# Patient Record
Sex: Male | Born: 1990 | Race: Black or African American | Hispanic: No | Marital: Married | Smoking: Former smoker
Health system: Southern US, Community
[De-identification: ages and names within clinical notes are randomized; demographics above are authoritative.]

## PROBLEM LIST (undated history)

## (undated) DIAGNOSIS — I10 Essential (primary) hypertension: Secondary | ICD-10-CM

## (undated) DIAGNOSIS — K219 Gastro-esophageal reflux disease without esophagitis: Secondary | ICD-10-CM

## (undated) HISTORY — DX: Gastro-esophageal reflux disease without esophagitis: K21.9

---
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
## 2012-03-12 ENCOUNTER — Emergency Department (HOSPITAL_COMMUNITY)
Admission: EM | Admit: 2012-03-12 | Discharge: 2012-03-12 | Disposition: A | Discharge status: Home / Self Care | Payer: Medicaid Other | Attending: Emergency Medicine | Admitting: Emergency Medicine

## 2012-03-12 ENCOUNTER — Encounter (HOSPITAL_COMMUNITY): Payer: Self-pay | Admitting: *Deleted

## 2012-03-12 ENCOUNTER — Emergency Department (HOSPITAL_COMMUNITY): Payer: Medicaid Other

## 2012-03-12 DIAGNOSIS — R220 Localized swelling, mass and lump, head: Secondary | ICD-10-CM | POA: Insufficient documentation

## 2012-03-12 DIAGNOSIS — IMO0002 Reserved for concepts with insufficient information to code with codable children: Secondary | ICD-10-CM | POA: Insufficient documentation

## 2012-03-12 DIAGNOSIS — R109 Unspecified abdominal pain: Secondary | ICD-10-CM | POA: Insufficient documentation

## 2012-03-12 DIAGNOSIS — R51 Headache: Secondary | ICD-10-CM | POA: Insufficient documentation

## 2012-03-12 DIAGNOSIS — R112 Nausea with vomiting, unspecified: Secondary | ICD-10-CM | POA: Insufficient documentation

## 2012-03-12 DIAGNOSIS — T07XXXA Unspecified multiple injuries, initial encounter: Secondary | ICD-10-CM | POA: Insufficient documentation

## 2012-03-12 DIAGNOSIS — M542 Cervicalgia: Secondary | ICD-10-CM | POA: Insufficient documentation

## 2012-03-12 DIAGNOSIS — I10 Essential (primary) hypertension: Secondary | ICD-10-CM | POA: Insufficient documentation

## 2012-03-12 DIAGNOSIS — S46912A Strain of unspecified muscle, fascia and tendon at shoulder and upper arm level, left arm, initial encounter: Secondary | ICD-10-CM

## 2012-03-12 HISTORY — DX: Essential (primary) hypertension: I10

## 2012-03-12 MED ORDER — ONDANSETRON HCL 4 MG/2ML IJ SOLN
4.0000 mg | Freq: Once | INTRAMUSCULAR | Status: AC
  Administered 2012-03-12: 4 mg via INTRAVENOUS
  Filled 2012-03-12: qty 2

## 2012-03-12 MED ORDER — IBUPROFEN 800 MG PO TABS: 800.0000 mg | ORAL_TABLET | Freq: Three times a day (TID) | ORAL | Status: DC

## 2012-03-12 MED ORDER — MORPHINE SULFATE 4 MG/ML IJ SOLN
4.0000 mg | Freq: Once | INTRAMUSCULAR | Status: AC
  Administered 2012-03-12: 4 mg via INTRAVENOUS
  Filled 2012-03-12: qty 1

## 2012-03-12 MED ORDER — ONDANSETRON HCL 4 MG PO TABS: 4.0000 mg | ORAL_TABLET | Freq: Four times a day (QID) | ORAL | Status: DC

## 2012-03-12 NOTE — ED Provider Notes (Signed)
  Medical screening examination/treatment/procedure(s) were performed by non-physician practitioner and as supervising physician I was immediately available for consultation/collaboration.    Vida Roller, MD 03/12/12 [phone]

## 2012-03-12 NOTE — ED Notes (Signed)
Pt had pizza this am and after eating pizza developed n/vx 5.
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
## 2012-03-12 NOTE — ED Notes (Signed)
Pt was given zofran 4 mg iv per ems and 1 liter bolus.

## 2020-07-14 ENCOUNTER — Emergency Department (HOSPITAL_COMMUNITY)
Admission: EM | Admit: 2020-07-14 | Discharge: 2020-07-14 | Disposition: A | Discharge status: Home / Self Care | Payer: No Typology Code available for payment source | Attending: Emergency Medicine | Admitting: Emergency Medicine

## 2020-07-14 ENCOUNTER — Emergency Department (HOSPITAL_COMMUNITY): Payer: No Typology Code available for payment source

## 2020-07-14 ENCOUNTER — Other Ambulatory Visit: Payer: Self-pay

## 2020-07-14 ENCOUNTER — Encounter (HOSPITAL_COMMUNITY): Payer: Self-pay

## 2020-07-14 DIAGNOSIS — M542 Cervicalgia: Secondary | ICD-10-CM | POA: Diagnosis not present

## 2020-07-14 DIAGNOSIS — G44209 Tension-type headache, unspecified, not intractable: Secondary | ICD-10-CM

## 2020-07-14 DIAGNOSIS — I10 Essential (primary) hypertension: Secondary | ICD-10-CM | POA: Insufficient documentation

## 2020-07-14 DIAGNOSIS — R519 Headache, unspecified: Secondary | ICD-10-CM | POA: Diagnosis present

## 2020-07-14 DIAGNOSIS — Y92410 Unspecified street and highway as the place of occurrence of the external cause: Secondary | ICD-10-CM | POA: Diagnosis not present

## 2020-07-14 MED ORDER — FLUTICASONE PROPIONATE 50 MCG/ACT NA SUSP
2.0000 | Freq: Every day | NASAL | Status: DC
  Filled 2020-07-14: qty 16

## 2020-07-14 MED ORDER — LIDOCAINE 5 % EX PTCH
1.0000 | MEDICATED_PATCH | CUTANEOUS | Status: DC
  Administered 2020-07-14: 1 via TRANSDERMAL
  Filled 2020-07-14: qty 1

## 2020-07-14 MED ORDER — PROCHLORPERAZINE EDISYLATE 10 MG/2ML IJ SOLN
10.0000 mg | Freq: Once | INTRAMUSCULAR | Status: AC
  Administered 2020-07-14: 10 mg via INTRAVENOUS
  Filled 2020-07-14: qty 2

## 2020-07-14 MED ORDER — LACTATED RINGERS IV BOLUS
1000.0000 mL | Freq: Once | INTRAVENOUS | Status: AC
  Administered 2020-07-14: 1000 mL via INTRAVENOUS

## 2020-07-14 MED ORDER — DIPHENHYDRAMINE HCL 50 MG/ML IJ SOLN
25.0000 mg | Freq: Once | INTRAMUSCULAR | Status: AC
  Administered 2020-07-14: 25 mg via INTRAVENOUS
  Filled 2020-07-14: qty 1

## 2020-07-14 MED ORDER — KETOROLAC TROMETHAMINE 15 MG/ML IJ SOLN
15.0000 mg | Freq: Once | INTRAMUSCULAR | Status: AC
  Administered 2020-07-14: 15 mg via INTRAMUSCULAR
  Filled 2020-07-14: qty 1
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
## 2020-07-14 NOTE — Discharge Instructions (Addendum)
Take ibuprofen and/or acetaminophen as needed for headache.  Apply ice as needed.
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
## 2020-11-05 ENCOUNTER — Other Ambulatory Visit: Payer: Self-pay

## 2020-11-05 ENCOUNTER — Ambulatory Visit (HOSPITAL_COMMUNITY)
Admission: EM | Admit: 2020-11-05 | Discharge: 2020-11-05 | Disposition: A | Discharge status: Home / Self Care | Payer: Self-pay | Attending: Emergency Medicine | Admitting: Emergency Medicine

## 2020-11-05 ENCOUNTER — Encounter (HOSPITAL_COMMUNITY): Payer: Self-pay | Admitting: Emergency Medicine

## 2020-11-05 DIAGNOSIS — R42 Dizziness and giddiness: Secondary | ICD-10-CM

## 2020-11-05 DIAGNOSIS — R519 Headache, unspecified: Secondary | ICD-10-CM

## 2020-11-05 DIAGNOSIS — E860 Dehydration: Secondary | ICD-10-CM

## 2020-11-05 MED ORDER — KETOROLAC TROMETHAMINE 30 MG/ML IJ SOLN
INTRAMUSCULAR | Status: AC
  Filled 2020-11-05: qty 1

## 2020-11-05 MED ORDER — ONDANSETRON 4 MG PO TBDP: 4.0000 mg | ORAL_TABLET | Freq: Three times a day (TID) | ORAL | 0 refills | Status: DC | PRN

## 2020-11-05 MED ORDER — IBUPROFEN 600 MG PO TABS: 600.0000 mg | ORAL_TABLET | Freq: Four times a day (QID) | ORAL | 0 refills | Status: DC | PRN

## 2020-11-05 MED ORDER — ACETAMINOPHEN 325 MG PO TABS
975.0000 mg | ORAL_TABLET | Freq: Once | ORAL | Status: AC
  Administered 2020-11-05: 975 mg via ORAL

## 2020-11-05 MED ORDER — ACETAMINOPHEN 325 MG PO TABS
ORAL_TABLET | ORAL | Status: AC
  Filled 2020-11-05: qty 3

## 2020-11-05 MED ORDER — KETOROLAC TROMETHAMINE 30 MG/ML IJ SOLN
30.0000 mg | Freq: Once | INTRAMUSCULAR | Status: AC
  Administered 2020-11-05: 30 mg via INTRAMUSCULAR

## 2020-11-05 NOTE — ED Triage Notes (Signed)
C/o dizziness, headache, concerned for blood pressure is up.  This started yesterday.  If patient"moves too fast, he gets dizzy"

## 2020-11-05 NOTE — ED Provider Notes (Signed)
HPI  SUBJECTIVE:  Robert Vazquez is a 30 y.o. male who reports a constant, gradual onset, diffuse headache associated with intermittent episodes of dizziness that lasts 30 to 60 seconds described as feeling off balance.  Headache started yesterday.  He denies vertigo.  He reports high-pitched tinnitus in his left ear yesterday, but this has resolved.  He reports mild photophobia, phonophobia.  He has not tried anything for his headache.  No alleviating factors.  Symptoms worse with noise.  The dizziness is worse with standing up rapidly, with turning his head.  No ear pain, change in hearing, otorrhea, nausea, vomiting, visual changes, fevers, nasal congestion, sinus pain or pressure, jaw pain, dental pain, neck stiffness, chest pain, palpitations, shortness of breath, arm or leg weakness, facial droop, slurred speech, syncope, seizures.  No nausea, diaphoresis with the dizziness.  This is not the first or worst headache of his life.  He has never had dizziness like this before.  States that he drinks about a cup of water per day, and has been working outside in the heat without rehydrating.  He states that his urine is darker than usual.  He has a past medical history of headaches, states this feels similar to previous, hypertension, has not been on medications in a year.  No history of diabetes, MI, coronary disease, vertigo, stroke, aneurysm, atrial fibrillation, illicit drug use.  Family history negative for stroke, aneurysm.  PMD: None.    Past Medical History:  Diagnosis Date   Hypertension     History reviewed. No pertinent surgical history.  Family History  Problem Relation Age of Onset   CAD Father     Social History   Tobacco Use   Smoking status: Never   Smokeless tobacco: Never  Vaping Use   Vaping Use: Every day  Substance Use Topics   Alcohol use: No   Drug use: No    No current facility-administered medications for this encounter.  Current Outpatient Medications:     ibuprofen (ADVIL) 600 MG tablet, Take 1 tablet (600 mg total) by mouth every 6 (six) hours as needed., Disp: 30 tablet, Rfl: 0   ondansetron (ZOFRAN ODT) 4 MG disintegrating tablet, Take 1 tablet (4 mg total) by mouth every 8 (eight) hours as needed for nausea or vomiting., Disp: 20 tablet, Rfl: 0  No Known Allergies   ROS  As noted in HPI.   Physical Exam  BP 124/62 (BP Location: Right Arm)   Pulse 66   Temp 98.1 F (36.7 C) (Oral)   Resp 20   SpO2 99%   Constitutional: Well developed, well nourished, no acute distress Eyes: PERRL, EOMI, conjunctiva normal bilaterally.  Mild photophobia. HENT: Normocephalic, atraumatic,mucus membranes moist, normal dentition.  TM normal b/l. No TMJ tenderness.  No nasal congestion, no sinus tenderness. No temporal artery tenderness.  Neck: no cervical LN.  Positive bilateral trapezial muscle tenderness. No meningismus Respiratory: normal inspiratory effort Cardiovascular: Normal rate, regular rhythm.  No murmurs, rubs, gallops.  No carotid bruit. GI:  nondistended skin: No rash, skin intact Musculoskeletal: No edema, no tenderness, no deformities Neurologic: Alert & oriented x 3, CN III-XII intact, romberg neg, finger-> nose, heel-> shin equal b/l, Romberg neg, tandem gait steady.  Positive Dix-Hallpike bilaterally on the left more than right.  Positive dizziness with sitting up rapidly. Psychiatric: Speech and behavior appropriate   ED Course  Medications - No data to display  No orders of the defined types were placed in this encounter.  No results found for this or any previous visit (from the past 24 hour(s)). No results found.   ED Clinical Impression  1. Dehydration   2. Lightheadedness   3. Acute nonintractable headache, unspecified headache type     ED Assessment/Plan  Pt describing typical pain, no sudden onset. Doubt SAH, ICH or space occupying lesion. Pt without fevers/chills, Pt has no meningeal sx, no nuchal  rigidity. Doubt meningitis. Pt with normal neuro exam, no evidence of CVA/TIA.  Pt BP not elevated significantly, doubt hypertensive emergency. No evidence of temporal artery tenderness, no evidence of glaucoma or other ocular pathology.   Suspect that the patient's headache and lightheadedness is coming from being dehydrated.  Also may have a musculoskeletal component due to the bilateral trapezial tenderness.  No evidence of meningitis.  States he drinks a cup of water and has been outside working in the heat.  He has been working more recently.  Does not appear to be vertigo.  He gets dizzy with large positional changes.  No evidence of stroke.  Doubt aneurysm.  Will have him push fluids, send home with Tylenol/ibuprofen, Zofran in case this is a migraine.  Giving Toradol and Tylenol here.  Will provide primary care list for ongoing care and order assistance in finding a PMD.  Strict ER return precautions given.  Discussed medical decision making, treatment plan, plan for follow-up, signs and symptoms that should prompt return to the emergency department.  Patient agrees with plan   Meds ordered this encounter  Medications   ibuprofen (ADVIL) 600 MG tablet    Sig: Take 1 tablet (600 mg total) by mouth every 6 (six) hours as needed.    Dispense:  30 tablet    Refill:  0   ondansetron (ZOFRAN ODT) 4 MG disintegrating tablet    Sig: Take 1 tablet (4 mg total) by mouth every 8 (eight) hours as needed for nausea or vomiting.    Dispense:  20 tablet    Refill:  0    *This clinic note was created using Scientist, clinical (histocompatibility and immunogenetics). Therefore, there may be occasional mistakes despite careful proofreading.  ?    Domenick Gong, MD 11/06/20 [phone]

## 2020-11-05 NOTE — Discharge Instructions (Addendum)
I suspect that your symptoms are from being dehydrated.  You must drink at least 2 L of nonalcoholic, not sugary, noncaffeinated beverages a day, more if you are outside working in the heat.  I want you pushing electrolyte containing fluids such as Pedialyte or Gatorade until your urine is clear.  You may take 600 mg of ibuprofen combined with 1000 mg of Tylenol 3-4 times a day as needed for headache.  Zofran in case you get nauseous.  Follow-up with a primary care provider of your choice, see list below Below is a list of primary care practices who are taking new patients for you to follow-up with.  [facility] internal medicine clinic Ground Floor - [facility], [address] [place], [place], [place] [address] [phone]  [facility] at [facility] [address] [place], [place] [address] [phone]  [facility] [address] [place], [place] [address] [phone]  [facility] [phone] [address] [place] [address]  [facility]: [address] [place] [address]  [phone]  [facility]: [address] [place] [address]  [phone]  [place] [facility] : [address] [place] [address] [phone]  [facility]: [address] [place] [address] [phone]  [facility]: [address] [place] [address] [phone]  Dr. Oneal Grout [address] [facility] [place] [address]  [phone]  Go to www.goodrx.com  or www.costplusdrugs.com to look up your medications. This will give you a list of where you can find your prescriptions at the most affordable prices. Or ask the pharmacist what the cash price is, or if they have any other discount programs  available to help make your medication more affordable. This can be less expensive than what you would pay with insurance.

## 2020-11-07 ENCOUNTER — Encounter (HOSPITAL_COMMUNITY): Payer: Self-pay | Admitting: Emergency Medicine

## 2020-11-07 ENCOUNTER — Emergency Department (HOSPITAL_COMMUNITY)
Admission: EM | Admit: 2020-11-07 | Discharge: 2020-11-08 | Disposition: A | Discharge status: Home / Self Care | Payer: Self-pay | Attending: Emergency Medicine | Admitting: Emergency Medicine

## 2020-11-07 ENCOUNTER — Other Ambulatory Visit: Payer: Self-pay

## 2020-11-07 ENCOUNTER — Emergency Department (HOSPITAL_COMMUNITY): Payer: Self-pay

## 2020-11-07 DIAGNOSIS — R42 Dizziness and giddiness: Secondary | ICD-10-CM | POA: Insufficient documentation

## 2020-11-07 DIAGNOSIS — I10 Essential (primary) hypertension: Secondary | ICD-10-CM | POA: Insufficient documentation

## 2020-11-07 DIAGNOSIS — R0789 Other chest pain: Secondary | ICD-10-CM | POA: Insufficient documentation

## 2020-11-07 DIAGNOSIS — R202 Paresthesia of skin: Secondary | ICD-10-CM | POA: Insufficient documentation

## 2020-11-07 LAB — CBC WITH DIFFERENTIAL/PLATELET
Abs Immature Granulocytes: 0.02 10*3/uL (ref 0.00–0.07)
Basophils Absolute: 0.1 10*3/uL (ref 0.0–0.1)
Basophils Relative: 1 %
Eosinophils Absolute: 0.1 10*3/uL (ref 0.0–0.5)
Eosinophils Relative: 2 %
HCT: 45.5 % (ref 39.0–52.0)
Hemoglobin: 15.4 g/dL (ref 13.0–17.0)
Immature Granulocytes: 0 %
Lymphocytes Relative: 38 %
Lymphs Abs: 1.8 10*3/uL (ref 0.7–4.0)
MCH: 32.6 pg (ref 26.0–34.0)
MCHC: 33.8 g/dL (ref 30.0–36.0)
MCV: 96.2 fL (ref 80.0–100.0)
Monocytes Absolute: 0.4 10*3/uL (ref 0.1–1.0)
Monocytes Relative: 8 %
Neutro Abs: 2.5 10*3/uL (ref 1.7–7.7)
Neutrophils Relative %: 51 %
Platelets: 279 10*3/uL (ref 150–400)
RBC: 4.73 MIL/uL (ref 4.22–5.81)
RDW: 12.6 % (ref 11.5–15.5)
WBC: 4.9 10*3/uL (ref 4.0–10.5)
nRBC: 0 % (ref 0.0–0.2)

## 2020-11-07 NOTE — ED Triage Notes (Signed)
Patient arrived with EMS from home reports left chest pain and left torso pain for 3 days , no SOB or emesis , seen at an urgent care yesterday for the same complaints.

## 2020-11-07 NOTE — ED Provider Notes (Signed)
Emergency Medicine Provider Triage Evaluation Note  Robert Vazquez , a 30 y.o. male  was evaluated in triage.  Pt complains of dizziness.  The patient presents with a 2-day history of a constellation of symptoms left-sided chest pain that radiates up into his the left side of his neck, headaches, intermittent dizziness.  He is also noted that his tongue seems to be tingling.  He has noticed some twitching to his right thumb and also sporadically to his face.  He denies any other muscle cramping, but does note that his symptoms began when he was having generalized body aches.  No fever, chills, shortness of breath, vomiting.  He noted that his vision seemed to be worse while he was driving.  He describes it as " what he was seeing in front of him seem to be moving faster than what he could process."  No diplopia or vision loss.  He was seen at urgent care yesterday for the same and was given medication for headache and discharged home.  Patient adamantly denies cocaine use or other drug use.  No history of similar.  Review of Systems  Positive: Chest pain, myalgias, visual changes, headache, dizziness, muscle fasciculations, change in taste Negative: Abdominal pain, shortness of breath, rash, paresthesias, numbness, weakness, vomiting  Physical Exam  BP 133/88 (BP Location: Right Arm)   Pulse 80   Temp 98.2 F (36.8 C) (Oral)   Resp 20   SpO2 98%  Gen:   Awake, no distress   Resp:  Normal effort  MSK:   Moves extremities without difficulty  Other:  Sensation to light touch is intact to the bilateral arms and legs.  Extraocular movements are intact.  Mild reproducible tenderness to palpation to the left chest wall.  No crepitus or step-offs.  Medical Decision Making  Medically screening exam initiated at 10:50 PM.  Appropriate orders placed.  Jermanie Minshall Rodda was informed that the remainder of the evaluation will be completed by another provider, this initial triage assessment does not replace  that evaluation, and the importance of remaining in the ED until their evaluation is complete.  Patient's work-up has been initiated in the ED.  Labs have been ordered.  He will likely require additional imaging during his ED visit.  He will require further work-up and evaluation in the ED.   Frederik Pear A, PA-C 11/07/20 2253    Derwood Kaplan, MD 11/08/20 [phone]

## 2020-11-08 ENCOUNTER — Emergency Department (HOSPITAL_COMMUNITY): Payer: Self-pay

## 2020-11-08 LAB — COMPREHENSIVE METABOLIC PANEL
ALT: 17 U/L (ref 0–44)
AST: 25 U/L (ref 15–41)
Albumin: 4 g/dL (ref 3.5–5.0)
Alkaline Phosphatase: 64 U/L (ref 38–126)
Anion gap: 8 (ref 5–15)
BUN: 12 mg/dL (ref 6–20)
CO2: 27 mmol/L (ref 22–32)
Calcium: 9.5 mg/dL (ref 8.9–10.3)
Chloride: 104 mmol/L (ref 98–111)
Creatinine, Ser: 1.36 mg/dL — ABNORMAL HIGH (ref 0.61–1.24)
GFR, Estimated: >60 mL/min (ref >60)
Glucose, Bld: 94 mg/dL (ref 70–99)
Potassium: 3.4 mmol/L — ABNORMAL LOW (ref 3.5–5.1)
Sodium: 139 mmol/L (ref 135–145)
Total Bilirubin: 0.6 mg/dL (ref 0.3–1.2)
Total Protein: 7.1 g/dL (ref 6.5–8.1)

## 2020-11-08 LAB — CK: Total CK: 496 U/L — ABNORMAL HIGH (ref 49–397)

## 2020-11-08 LAB — TROPONIN I (HIGH SENSITIVITY)
Troponin I (High Sensitivity): 4 ng/L (ref <18)
Troponin I (High Sensitivity): 5 ng/L (ref <18)

## 2020-11-08 MED ORDER — KETOROLAC TROMETHAMINE 60 MG/2ML IM SOLN
60.0000 mg | Freq: Once | INTRAMUSCULAR | Status: AC
  Administered 2020-11-08: 60 mg via INTRAMUSCULAR
  Filled 2020-11-08: qty 2

## 2020-11-08 MED ORDER — KETOROLAC TROMETHAMINE 15 MG/ML IJ SOLN: 15.0000 mg | Freq: Once | INTRAMUSCULAR | Status: DC

## 2020-11-08 NOTE — ED Provider Notes (Signed)
[name] EMERGENCY DEPARTMENT Provider Note   CSN: [id number] Arrival date & time: 11/07/20  2212     History Chief Complaint  Patient presents with   Chest Pain    Robert Vazquez is a 30 y.o. male.  The history is provided by the patient, the spouse and medical records.  Chest Pain Robert Vazquez is a 30 y.o. male who presents to the Emergency Department complaining of chest pain.  He presents to the ED accompanied by his wife for evaluation of left sided chest pain that started two days ago.  Pain is described as sharp, non radiating.  Pain is constant.  No significant change with breathing, movement, activity.    No associated fever, cough, AP, N/V, leg swelling/pain.  Has some mild lightheadedness.    Had similar episode in the past and cause was not identified.  He is right hand dominant.    Takes no medications.  No hx/o DVT/PE.  Unsure of family cardiac hx.  Vapes.  No alcohol.  No drugs.    Earlier he did experience numbness to the right tongue and felt like his right hand was twitching.  Episode lasted about 15 minutes.  He was anxious when this occurred.      Past Medical History:  Diagnosis Date   Hypertension     There are no problems to display for this patient.   History reviewed. No pertinent surgical history.     Family History  Problem Relation Age of Onset   CAD Father     Social History   Tobacco Use   Smoking status: Never   Smokeless tobacco: Never  Vaping Use   Vaping Use: Every day  Substance Use Topics   Alcohol use: No   Drug use: No    Home Medications Prior to Admission medications   Medication Sig Start Date End Date Taking? Authorizing Provider  ibuprofen (ADVIL) 600 MG tablet Take 1 tablet (600 mg total) by mouth every 6 (six) hours as needed. 11/05/20   Domenick Gong, MD  ondansetron (ZOFRAN ODT) 4 MG disintegrating tablet Take 1 tablet (4 mg total) by mouth every 8 (eight) hours as needed for  nausea or vomiting. 11/05/20   Domenick Gong, MD    Allergies    Patient has no known allergies.  Review of Systems   Review of Systems  Cardiovascular:  Positive for chest pain.  All other systems reviewed and are negative.  Physical Exam Updated Vital Signs BP 124/83 (BP Location: Right Arm)   Pulse 61   Temp 98.5 F (36.9 C) (Oral)   Resp 16   SpO2 100%   Physical Exam Vitals and nursing note reviewed.  Constitutional:      Appearance: He is well-developed.  HENT:     Head: Normocephalic and atraumatic.  Cardiovascular:     Rate and Rhythm: Normal rate and regular rhythm.     Heart sounds: No murmur heard. Pulmonary:     Effort: Pulmonary effort is normal. No respiratory distress.     Breath sounds: Normal breath sounds.     Comments: TTP over left chest wall Abdominal:     Palpations: Abdomen is soft.     Tenderness: There is no abdominal tenderness. There is no guarding or rebound.  Musculoskeletal:        General: No swelling or tenderness.     Comments: 2+ DP pulses bilaterally.    Skin:    General: Skin is warm and  dry.  Neurological:     Mental Status: He is alert and oriented to person, place, and time.     Comments: No asymmetry of facial movement.  Visual fields grossly intact.  Sensation to light touch intact in face, BUE/BLE.  5/5 strength in all four extremities.    Psychiatric:        Behavior: Behavior normal.    ED Results / Procedures / Treatments   Labs (all labs ordered are listed, but only abnormal results are displayed) Labs Reviewed  CK - Abnormal; Notable for the following components:      Result Value   Total CK 496 (*)    All other components within normal limits  COMPREHENSIVE METABOLIC PANEL - Abnormal; Notable for the following components:   Potassium 3.4 (*)    Creatinine, Ser 1.36 (*)    All other components within normal limits  CBC WITH DIFFERENTIAL/PLATELET  TROPONIN I (HIGH SENSITIVITY)  TROPONIN I (HIGH SENSITIVITY)     EKG None ED ECG REPORT   Date: 11/08/2020  Rate: 57  Rhythm: normal sinus rhythm  QRS Axis: normal  Intervals: normal  ST/T Wave abnormalities: early repolarization  Conduction Disutrbances:none  Narrative Interpretation:   Old EKG Reviewed: none available  I have personally reviewed the EKG tracing and disagree with the computerized printout as noted.  Radiology DG Chest 2 View  Result Date: 11/07/2020 CLINICAL DATA:  Chest pain EXAM: CHEST - 2 VIEW COMPARISON:  None. FINDINGS: The heart size and mediastinal contours are within normal limits. Both lungs are clear. The visualized skeletal structures are unremarkable. IMPRESSION: No active cardiopulmonary disease. Electronically Signed   By: Jasmine Pang M.D.   On: 11/07/2020 23:14    Procedures Procedures   Medications Ordered in ED Medications - No data to display  ED Course  I have reviewed the triage vital signs and the nursing notes.  Pertinent labs & imaging results that were available during my care of the patient were reviewed by me and considered in my medical decision making (see chart for details).    MDM Rules/Calculators/A&P                          patient here for evaluation of two days of sharp, left sided chest pain. Pain is reproducible on examination. EKG does show ST elevation that is consistent with early repolarization in troponin's are negative times two. Presentation is not consistent with ACS, PE, dissection. On record review patient did mention some paresthesias when he presented initially. Discussed this with him. He did have some right facial tingling in right hand jerking as well as tongue tingling that lasted for a few minutes and that is what prompted the EMS call. The symptoms have completely resolved on ED presentation. He is neurologically intact on evaluation. Presentation is not consistent with CVA, subarachnoid hemorrhage, dissection. Discussed home care for paresthesias that are not  resolved as well as chest wall pain. Discussed outpatient follow-up and return precautions.  Final Clinical Impression(s) / ED Diagnoses Final diagnoses:  Chest wall pain  Paresthesia    Rx / DC Orders ED Discharge Orders     None        Tilden Fossa, MD 11/08/20 [phone]

## 2020-11-19 ENCOUNTER — Encounter: Payer: Self-pay | Admitting: *Deleted

## 2021-03-23 ENCOUNTER — Encounter (HOSPITAL_COMMUNITY): Payer: Self-pay | Admitting: Oncology

## 2021-03-23 ENCOUNTER — Emergency Department (HOSPITAL_COMMUNITY)
Admission: EM | Admit: 2021-03-23 | Discharge: 2021-03-23 | Disposition: A | Discharge status: Home / Self Care | Payer: Self-pay | Attending: Emergency Medicine | Admitting: Emergency Medicine

## 2021-03-23 ENCOUNTER — Emergency Department (HOSPITAL_COMMUNITY): Payer: Self-pay

## 2021-03-23 ENCOUNTER — Other Ambulatory Visit: Payer: Self-pay

## 2021-03-23 DIAGNOSIS — R079 Chest pain, unspecified: Secondary | ICD-10-CM

## 2021-03-23 DIAGNOSIS — I10 Essential (primary) hypertension: Secondary | ICD-10-CM | POA: Insufficient documentation

## 2021-03-23 DIAGNOSIS — M940 Chondrocostal junction syndrome [Tietze]: Secondary | ICD-10-CM | POA: Insufficient documentation

## 2021-03-23 DIAGNOSIS — E876 Hypokalemia: Secondary | ICD-10-CM | POA: Insufficient documentation

## 2021-03-23 LAB — BASIC METABOLIC PANEL
Anion gap: 7 (ref 5–15)
BUN: 9 mg/dL (ref 6–20)
CO2: 27 mmol/L (ref 22–32)
Calcium: 9.4 mg/dL (ref 8.9–10.3)
Chloride: 103 mmol/L (ref 98–111)
Creatinine, Ser: 1.16 mg/dL (ref 0.61–1.24)
GFR, Estimated: >60 mL/min (ref >60)
Glucose, Bld: 100 mg/dL — ABNORMAL HIGH (ref 70–99)
Potassium: 3.4 mmol/L — ABNORMAL LOW (ref 3.5–5.1)
Sodium: 137 mmol/L (ref 135–145)

## 2021-03-23 LAB — CBC
HCT: 46.4 % (ref 39.0–52.0)
Hemoglobin: 15.8 g/dL (ref 13.0–17.0)
MCH: 32 pg (ref 26.0–34.0)
MCHC: 34.1 g/dL (ref 30.0–36.0)
MCV: 93.9 fL (ref 80.0–100.0)
Platelets: 314 10*3/uL (ref 150–400)
RBC: 4.94 MIL/uL (ref 4.22–5.81)
RDW: 12.5 % (ref 11.5–15.5)
WBC: 8.8 10*3/uL (ref 4.0–10.5)
nRBC: 0 % (ref 0.0–0.2)

## 2021-03-23 LAB — TROPONIN I (HIGH SENSITIVITY): Troponin I (High Sensitivity): <2 ng/L (ref <18)

## 2021-03-23 NOTE — ED Provider Notes (Signed)
Emergency Medicine Provider Triage Evaluation Note  Robert Vazquez , a 30 y.o. male  was evaluated in triage.  Pt complains of productive cough x 1 month. Diagnosed with acute bronchitis and viral infection yesterday at [facility] and prescribed doxy and prednisone. Pt here today because of chest pain s/2 coughing.  Has been taking Ibuprofen and Tylenol at home with mild relief of pain. Pt is concerned that he has "too much mucous" and that he "needs something strong" to get it out. No fevers or chills.   Review of Systems  Positive: + cough, chest pain Negative: - fevers, chills,   Physical Exam  BP 137/75 (BP Location: Left Arm)    Pulse 70    Temp 98 F (36.7 C) (Oral)    Resp 15    Ht 6\' 1"  (1.854 m)    Wt 109.8 kg    SpO2 98%    BMI 31.93 kg/m  Gen:   Awake, no distress   Resp:  Normal effort. LCTAB.  MSK:   Moves extremities without difficulty  Other:    Medical Decision Making  Medically screening exam initiated at 8:13 AM.  Appropriate orders placed.  Robert Vazquez was informed that the remainder of the evaluation will be completed by another provider, this initial triage assessment does not replace that evaluation, and the importance of remaining in the ED until their evaluation is complete.     Rexene Agent, PA-C 03/23/21 03/25/21    [id number], MD 03/23/21 [phone]

## 2021-03-23 NOTE — Discharge Instructions (Addendum)
Your workup was overall reassuring in the ED today  I would recommend continuing to take Ibuprofen and Tylenol as needed for pain.  Continue taking the medications provided to you at Urgent Care yesterday. These medications will take some time to start working effectively.   Follow up with your PCP for further evaluation. If you do not have a PCP you can follow up with [facility] for primary care needs.   Return to the ED for any new/worsening symptoms

## 2021-03-23 NOTE — ED Triage Notes (Signed)
Pt reports being sick x one month cough present that whole time.  Pt seen yesterday at UC rx doxy and prednisone.  Pt reports ongoing generalized CP w/o radiation.

## 2021-03-23 NOTE — ED Provider Notes (Signed)
[facility]-EMERGENCY DEPT Provider Note   CSN: [id number] Arrival date & time: 03/23/21  0750     History Chief Complaint  Patient presents with   Chest Pain    Robert Vazquez is a 30 y.o. male with Pmhx HTN who presents to the ED today with complaint of productive cough x 3 weeks-1 month.  Patient states at the beginning of his illness he was having fevers, rhinorrhea, congestion after being around his cousin who was sick.  He states that those symptoms have all gone away however he continues to have a dry cough at this time.  He does report he has been experiencing chest pain with coughing as well.  He went to urgent care yesterday and had a chest x-ray done and was diagnosed with acute bronchitis as well as a viral infection.  He was discharged home with doxycycline and prednisone and he is taken 1 full day of same.  He has not taken his morning dose today.  He states that he continues to have chest pain and wanted to come to the ED for further evaluation.  He has been taking ibuprofen and Tylenol at home with mild relief of the pain.  He is also taking Mucinex as he feels like he has congestion in his chest.  Patient is concerned that he has too much mucus and that he needs something stronger to get it out.  He denies any recent fevers or chills.  Denies any pleuritic type chest pain.  Denies any shortness of breath.   The history is provided by the patient and medical records.      Past Medical History:  Diagnosis Date   Hypertension     There are no problems to display for this patient.   History reviewed. No pertinent surgical history.     Family History  Problem Relation Age of Onset   CAD Father     Social History   Tobacco Use   Smoking status: Never   Smokeless tobacco: Never  Vaping Use   Vaping Use: Every day  Substance Use Topics   Alcohol use: No   Drug use: No    Home Medications Prior to Admission medications   Medication Sig  Start Date End Date Taking? Authorizing Provider  ibuprofen (ADVIL) 600 MG tablet Take 1 tablet (600 mg total) by mouth every 6 (six) hours as needed. Patient taking differently: Take 600 mg by mouth every 6 (six) hours as needed for headache or moderate pain. 11/05/20   Domenick Gong, MD  ondansetron (ZOFRAN ODT) 4 MG disintegrating tablet Take 1 tablet (4 mg total) by mouth every 8 (eight) hours as needed for nausea or vomiting. Patient not taking: Reported on 11/08/2020 11/05/20   Domenick Gong, MD    Allergies    Patient has no known allergies.  Review of Systems   Review of Systems  Constitutional:  Negative for chills, diaphoresis and fever.  Respiratory:  Positive for cough. Negative for shortness of breath.   Cardiovascular:  Positive for chest pain.  Gastrointestinal:  Negative for nausea and vomiting.  All other systems reviewed and are negative.  Physical Exam Updated Vital Signs BP 137/75 (BP Location: Left Arm)    Pulse 70    Temp 98 F (36.7 C) (Oral)    Resp 15    Ht 6\' 1"  (1.854 m)    Wt 109.8 kg    SpO2 98%    BMI 31.93 kg/m   Physical Exam  Vitals and nursing note reviewed.  Constitutional:      Appearance: He is not ill-appearing or diaphoretic.  HENT:     Head: Normocephalic and atraumatic.  Eyes:     Conjunctiva/sclera: Conjunctivae normal.  Cardiovascular:     Rate and Rhythm: Normal rate and regular rhythm.     Pulses:          Radial pulses are 2+ on the right side and 2+ on the left side.       Dorsalis pedis pulses are 2+ on the right side and 2+ on the left side.     Heart sounds: Normal heart sounds.  Pulmonary:     Effort: Pulmonary effort is normal.     Breath sounds: Normal breath sounds. No decreased breath sounds, wheezing, rhonchi or rales.     Comments: Speaking in full sentences without difficulty. Satting 98% on RA. LCTAB. Mild active dry cough.  Chest:     Chest wall: Tenderness present.     Comments: + diffuse anterior chest wall  TTP Abdominal:     Palpations: Abdomen is soft.     Tenderness: There is no abdominal tenderness.  Musculoskeletal:     Cervical back: Neck supple.     Right lower leg: No edema.     Left lower leg: No edema.  Skin:    General: Skin is warm and dry.  Neurological:     Mental Status: He is alert.    ED Results / Procedures / Treatments   Labs (all labs ordered are listed, but only abnormal results are displayed) Labs Reviewed  BASIC METABOLIC PANEL - Abnormal; Notable for the following components:      Result Value   Potassium 3.4 (*)    Glucose, Bld 100 (*)    All other components within normal limits  CBC  TROPONIN I (HIGH SENSITIVITY)    EKG EKG Interpretation  Date/Time:  Sunday March 23 2021 08:03:48 EST Ventricular Rate:  66 PR Interval:  160 QRS Duration: 82 QT Interval:  368 QTC Calculation: 386 R Axis:   66 Text Interpretation: Sinus rhythm Borderline T abnormalities, inferior leads ST elevation, consider lateral injury similar to prior EKGs Confirmed by Belfi, Melanie ([id number]) on 03/23/2021 10:02:58 AM  Radiology DG Chest 2 View  Result Date: 03/23/2021 CLINICAL DATA:  Pt complained of a productive cough x 1 month and was diagnosed with acute bronchitis and viral infection yesterday at UC and prescribed doxy and prednisone. Pt here today because of chest pain when coughing. Hx of HTN. EXAM: CHEST - 2 VIEW COMPARISON:  11/07/2020 FINDINGS: Normal heart, mediastinum and hila. Clear lungs.  No pleural effusion or pneumothorax. Skeletal structures are within normal limits. IMPRESSION: Normal chest radiographs. Electronically Signed   By: David  Ormond M.D.   On: 03/23/2021 08:52    Procedures Procedures   Medications Ordered in ED Medications - No data to display  ED Course  I have reviewed the triage vital signs and the nursing notes.  Pertinent labs & imaging results that were available during my care of the patient were reviewed by me and considered in  my medical decision making (see chart for details).    MDM Rules/Calculators/A&P                          30  year old male who presents to the ED today with complaint of ongoing cough as well as chest pain with coughing for the past 3 weeks  to 1 month.  On arrival to the ED today vitals are stable.  Patient is afebrile, nontachycardic and nontachypneic and appears to be no acute distress.  He is speaking in full sentences without difficulty.  No tachypnea.  His lungs are clear to auscultation bilaterally.  He is noted to have some anterior chest wall tenderness palpation.  He denies pleuritic type chest pain.  Denies any concerning symptoms for ACS.  Suspect he is likely having some costochondritis related to coughing however we will plan for further work-up at this time with EKG, chest x-ray, labs.  Given pain is not pleuritic in nature and patient is nontachycardic and nonhypoxic I have very low suspicion for PE.  EKG unchanged from previous CXR clear CBC without leukocytosis. Hgb stable at 15.8 BMP with potassium 3.4. No other electrolyte abnormalities Troponin < 2  Given chest pain has been ongoing for several weeks I do not feel pt requires repeat troponin testing.  I did have lengthy discussion with patient regarding the fact that his pain is likely related to inflammation in the lining of his lungs.  I have advised that he continue taking ibuprofen and Tylenol as needed for pain.  He is recommended to continue taking the medications prescribed in an urgent care yesterday.  Advised to follow-up with his primary care physician for further evaluation.  I have discussed strict return precautions with patient and he is in agreement with plan at this time.  This note was prepared using Dragon voice recognition software and may include unintentional dictation errors due to the inherent limitations of voice recognition software.   Final Clinical Impression(s) / ED Diagnoses Final diagnoses:   Nonspecific chest pain  Costochondritis    Rx / DC Orders ED Discharge Orders     None        Discharge Instructions      Your workup was overall reassuring in the ED today  I would recommend continuing to take Ibuprofen and Tylenol as needed for pain.  Continue taking the medications provided to you at Urgent Care yesterday. These medications will take some time to start working effectively.   Follow up with your PCP for further evaluation. If you do not have a PCP you can follow up with [facility] for primary care needs.   Return to the ED for any new/worsening symptoms       Tanda Rockers, PA-C 03/23/21 1007    Rolan Bucco, MD 03/23/21 1212

## 2021-03-23 NOTE — ED Notes (Signed)
Went to d/c pt from lobby, not visualized, no response.

## 2021-03-25 ENCOUNTER — Emergency Department (HOSPITAL_COMMUNITY): Payer: Self-pay

## 2021-03-25 ENCOUNTER — Other Ambulatory Visit: Payer: Self-pay

## 2021-03-25 ENCOUNTER — Emergency Department (HOSPITAL_COMMUNITY)
Admission: EM | Admit: 2021-03-25 | Discharge: 2021-03-25 | Disposition: A | Discharge status: Home / Self Care | Payer: Self-pay | Attending: Emergency Medicine | Admitting: Emergency Medicine

## 2021-03-25 ENCOUNTER — Encounter (HOSPITAL_COMMUNITY): Payer: Self-pay | Admitting: Emergency Medicine

## 2021-03-25 DIAGNOSIS — R059 Cough, unspecified: Secondary | ICD-10-CM | POA: Insufficient documentation

## 2021-03-25 DIAGNOSIS — R079 Chest pain, unspecified: Secondary | ICD-10-CM | POA: Insufficient documentation

## 2021-03-25 DIAGNOSIS — I10 Essential (primary) hypertension: Secondary | ICD-10-CM | POA: Insufficient documentation

## 2021-03-25 DIAGNOSIS — R531 Weakness: Secondary | ICD-10-CM | POA: Insufficient documentation

## 2021-03-25 DIAGNOSIS — R0602 Shortness of breath: Secondary | ICD-10-CM | POA: Insufficient documentation

## 2021-03-25 DIAGNOSIS — R42 Dizziness and giddiness: Secondary | ICD-10-CM | POA: Insufficient documentation

## 2021-03-25 MED ORDER — BENZONATATE 100 MG PO CAPS: 100.0000 mg | ORAL_CAPSULE | Freq: Three times a day (TID) | ORAL | 0 refills | Status: DC

## 2021-03-25 MED ORDER — MECLIZINE HCL 25 MG PO TABS: 25.0000 mg | ORAL_TABLET | Freq: Three times a day (TID) | ORAL | 0 refills | Status: DC | PRN

## 2021-03-25 NOTE — ED Triage Notes (Signed)
Pt reports chest pain, SHOB, and dizziness.  Dx w/ bronchitis yesterday at [facility] and given prednisone and antibiotics. Pt reports meds not working and still being "sick"

## 2021-03-25 NOTE — Discharge Instructions (Signed)
Your x-ray was negative.  Continue taking the medications you were prescribed.  Return to the emergency department if you begin to cough up blood to become severely short of breath or begin running high fevers

## 2021-03-25 NOTE — ED Provider Notes (Signed)
[facility]-EMERGENCY DEPT Provider Note   CSN: [id number] Arrival date & time: 03/25/21  1612     History Chief Complaint  Patient presents with   Chest Pain   Shortness of Breath   Dizziness    Robert Vazquez is a 30 y.o. male who presents emergency department for cough.  He has been seen at the urgent care in the ER.  He was diagnosed with bronchitis has been taking doxycycline and prednisone.  Patient states that he feels lightheaded, weak.  He has some mild nausea after starting doxycycline and prednisone.  He denies shortness of breath, fever, hemoptysis.  He does not understand why he does not feel any better.  He has not been taking any over-the-counter cold medications.   Chest Pain Associated symptoms: cough and dizziness   Associated symptoms: no palpitations and no shortness of breath   Shortness of Breath Associated symptoms: chest pain and cough   Dizziness Associated symptoms: chest pain   Associated symptoms: no palpitations and no shortness of breath       Past Medical History:  Diagnosis Date   Hypertension     There are no problems to display for this patient.   History reviewed. No pertinent surgical history.     Family History  Problem Relation Age of Onset   CAD Father     Social History   Tobacco Use   Smoking status: Never   Smokeless tobacco: Never  Vaping Use   Vaping Use: Every day  Substance Use Topics   Alcohol use: No   Drug use: No    Home Medications Prior to Admission medications   Medication Sig Start Date End Date Taking? Authorizing Provider  ibuprofen (ADVIL) 600 MG tablet Take 1 tablet (600 mg total) by mouth every 6 (six) hours as needed. Patient taking differently: Take 600 mg by mouth every 6 (six) hours as needed for headache or moderate pain. 11/05/20   Domenick Gong, MD  ondansetron (ZOFRAN ODT) 4 MG disintegrating tablet Take 1 tablet (4 mg total) by mouth every 8 (eight) hours as  needed for nausea or vomiting. Patient not taking: Reported on 11/08/2020 11/05/20   Domenick Gong, MD    Allergies    Patient has no known allergies.  Review of Systems   Review of Systems  Respiratory:  Positive for cough. Negative for shortness of breath.   Cardiovascular:  Positive for chest pain. Negative for palpitations.  Neurological:  Positive for dizziness.   Physical Exam Updated Vital Signs BP (!) 123/101 (BP Location: Left Arm)    Pulse 84    Temp 98.8 F (37.1 C) (Oral)    Resp 16    SpO2 96%   Physical Exam Vitals and nursing note reviewed.  Constitutional:      General: He is not in acute distress.    Appearance: He is well-developed. He is not diaphoretic.  HENT:     Head: Normocephalic and atraumatic.  Eyes:     General: No scleral icterus.    Conjunctiva/sclera: Conjunctivae normal.  Cardiovascular:     Rate and Rhythm: Normal rate and regular rhythm.     Heart sounds: Normal heart sounds.  Pulmonary:     Effort: Pulmonary effort is normal. No tachypnea or respiratory distress.     Breath sounds: No wheezing, rhonchi or rales.  Abdominal:     Palpations: Abdomen is soft.     Tenderness: There is no abdominal tenderness.  Musculoskeletal:  Cervical back: Normal range of motion and neck supple.  Skin:    General: Skin is warm and dry.  Neurological:     Mental Status: He is alert.  Psychiatric:        Behavior: Behavior normal.    ED Results / Procedures / Treatments   Labs (all labs ordered are listed, but only abnormal results are displayed) Labs Reviewed - No data to display  EKG None  Radiology DG Chest 2 View  Result Date: 03/25/2021 CLINICAL DATA:  Cough. EXAM: CHEST - 2 VIEW COMPARISON:  Chest radiograph dated 03/23/2021. FINDINGS: No focal consolidation, pleural effusion, or pneumothorax. The cardiac silhouette is within normal limits. No acute osseous pathology. IMPRESSION: No active cardiopulmonary disease. Electronically Signed    By: Elgie Collard M.D.   On: 03/25/2021 18:03    Procedures Procedures   Medications Ordered in ED Medications - No data to display  ED Course  I have reviewed the triage vital signs and the nursing notes.  Pertinent labs & imaging results that were available during my care of the patient were reviewed by me and considered in my medical decision making (see chart for details).    MDM Rules/Calculators/A&P                         Patient here with continued coughing.  He has mild hypertension.  Vital signs are all normal.  Normal heart rate.  No evidence of dehydration.  Think this patient has some reactive airway.  He may continue with his prednisone.  He has no wheezing.  Patient will be discharged with Tessalon and meclizine for his dizziness he is encouraged to increase his fluid hydration appears otherwise appropriate for discharge with a negative chest x-ray today.  Final Clinical Impression(s) / ED Diagnoses Final diagnoses:  None    Rx / DC Orders ED Discharge Orders     None        Arthor Captain, PA-C 03/25/21 1821    Alvira Monday, MD 03/26/21 1550

## 2021-03-25 NOTE — ED Notes (Signed)
D/c instructions to patient who verbalized understanding. He asked this RN to also please review d/c instructions with his mother who was on the phone. She stated she is an LPN and verbalized understanding of d/c instructions, prescription an follow up care. Pt A&OX4 ambulatory at d/c with independent steady gait.

## 2021-03-25 NOTE — ED Provider Notes (Signed)
Emergency Medicine Provider Triage Evaluation Note  Robert Vazquez , a 30 y.o. male  was evaluated in triage.  Pt complains of cough.  Patient has been seen at the ER in the urgent care was diagnosed with bronchitis and has taking doxycycline and prednisone.  He complains of lightheadedness.  He has taken Mucinex but he is no better.  He states that he does not understand why he does not feel any better.  No fevers.  He is a smoker..  Review of Systems  Positive: cough Negative: fever  Physical Exam  BP (!) 123/101 (BP Location: Left Arm)    Pulse 84    Temp 98.8 F (37.1 C) (Oral)    Resp 16    SpO2 96%  Gen:   Awake, no distress   Resp:  Normal effort  MSK:   Moves extremities without difficulty  Other:  Lungs are clear  Medical Decision Making  Medically screening exam initiated at 5:20 PM.  Appropriate orders placed.  Robert Vazquez was informed that the remainder of the evaluation will be completed by another provider, this initial triage assessment does not replace that evaluation, and the importance of remaining in the ED until their evaluation is complete.  Work up initiated   Robert Captain, PA-C 03/25/21 1728    Robert Monday, MD 03/26/21 1550

## 2021-07-09 NOTE — Progress Notes (Deleted)
?  I,Sha'taria Lus Kriegel,acting as a Neurosurgeon for [employer], PA-C.,have documented all relevant documentation on the behalf of Alfredia Ferguson, PA-C,as directed by  Alfredia Ferguson, PA-C while in the presence of Alfredia Ferguson, PA-C. ? ?New Patient Office Visit ? ?Subjective:  ?Patient ID: Robert Vazquez, male    DOB: Sep 11, 1990  Age: 31 y.o. MRN: [id number] ? ?CC: No chief complaint on file. ? ? ?HPI ?Robert Vazquez presents for establishing care and blood pressure concerns ? ?Past Medical History:  ?Diagnosis Date  ? Hypertension   ? ? ?No past surgical history on file. ? ?Family History  ?Problem Relation Age of Onset  ? CAD Father   ? ? ?Social History  ? ?Socioeconomic History  ? Marital status: Single  ?  Spouse name: Not on file  ? Number of children: Not on file  ? Years of education: Not on file  ? Highest education level: Not on file  ?Occupational History  ? Not on file  ?Tobacco Use  ? Smoking status: Never  ? Smokeless tobacco: Never  ?Vaping Use  ? Vaping Use: Every day  ?Substance and Sexual Activity  ? Alcohol use: No  ? Drug use: No  ? Sexual activity: Not Currently  ?Other Topics Concern  ? Not on file  ?Social History Narrative  ? Not on file  ? ?Social Determinants of Health  ? ?Financial Resource Strain: Not on file  ?Food Insecurity: Not on file  ?Transportation Needs: Not on file  ?Physical Activity: Not on file  ?Stress: Not on file  ?Social Connections: Not on file  ?Intimate Partner Violence: Not on file  ? ? ?ROS ?Review of Systems ? ?Objective:  ? ?Today's Vitals: There were no vitals taken for this visit. ? ?Physical Exam ? ?Assessment & Plan:  ? ?Problem List Items Addressed This Visit   ?None ? ? ?Outpatient Encounter Medications as of 07/10/2021  ?Medication Sig  ? benzonatate (TESSALON) 100 MG capsule Take 1 capsule (100 mg total) by mouth every 8 (eight) hours.  ? ibuprofen (ADVIL) 600 MG tablet Take 1 tablet (600 mg total) by mouth every 6 (six) hours as needed. (Patient taking  differently: Take 600 mg by mouth every 6 (six) hours as needed for headache or moderate pain.)  ? meclizine (ANTIVERT) 25 MG tablet Take 1 tablet (25 mg total) by mouth 3 (three) times daily as needed for dizziness.  ? ondansetron (ZOFRAN ODT) 4 MG disintegrating tablet Take 1 tablet (4 mg total) by mouth every 8 (eight) hours as needed for nausea or vomiting. (Patient not taking: Reported on 11/08/2020)  ? ?No facility-administered encounter medications on file as of 07/10/2021.  ? ? ?Follow-up: No follow-ups on file.  ? ?Acey Lav, CMA ? ?

## 2021-07-10 ENCOUNTER — Ambulatory Visit: Payer: Self-pay | Admitting: Physician Assistant

## 2022-03-17 ENCOUNTER — Emergency Department (HOSPITAL_BASED_OUTPATIENT_CLINIC_OR_DEPARTMENT_OTHER): Payer: Medicaid Other | Admitting: Radiology

## 2022-03-17 ENCOUNTER — Emergency Department (HOSPITAL_BASED_OUTPATIENT_CLINIC_OR_DEPARTMENT_OTHER)
Admission: EM | Admit: 2022-03-17 | Discharge: 2022-03-17 | Disposition: A | Discharge status: Home / Self Care | Payer: Medicaid Other | Attending: Emergency Medicine | Admitting: Emergency Medicine

## 2022-03-17 ENCOUNTER — Encounter (HOSPITAL_BASED_OUTPATIENT_CLINIC_OR_DEPARTMENT_OTHER): Payer: Self-pay | Admitting: Emergency Medicine

## 2022-03-17 ENCOUNTER — Other Ambulatory Visit: Payer: Self-pay

## 2022-03-17 DIAGNOSIS — M79651 Pain in right thigh: Secondary | ICD-10-CM | POA: Insufficient documentation

## 2022-03-17 DIAGNOSIS — M79652 Pain in left thigh: Secondary | ICD-10-CM | POA: Insufficient documentation

## 2022-03-17 DIAGNOSIS — Y92410 Unspecified street and highway as the place of occurrence of the external cause: Secondary | ICD-10-CM | POA: Insufficient documentation

## 2022-03-17 DIAGNOSIS — Y990 Civilian activity done for income or pay: Secondary | ICD-10-CM | POA: Insufficient documentation

## 2022-03-17 MED ORDER — NAPROXEN 500 MG PO TABS: 500.0000 mg | ORAL_TABLET | Freq: Two times a day (BID) | ORAL | 0 refills | Status: DC

## 2022-03-17 MED ORDER — METHOCARBAMOL 500 MG PO TABS: 1000.0000 mg | ORAL_TABLET | Freq: Two times a day (BID) | ORAL | 0 refills | Status: AC | PRN

## 2022-03-17 MED ORDER — OXYCODONE-ACETAMINOPHEN 5-325 MG PO TABS
1.0000 | ORAL_TABLET | Freq: Once | ORAL | Status: AC
  Administered 2022-03-17: 1 via ORAL
  Filled 2022-03-17: qty 1
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
## 2022-03-17 NOTE — ED Notes (Signed)
Patient verbalizes understanding of discharge instructions. Opportunity for questioning and answers were provided. Patient discharged from ED.  °
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
## 2022-05-06 ENCOUNTER — Ambulatory Visit: Payer: Medicaid Other | Admitting: Nurse Practitioner

## 2022-05-06 DIAGNOSIS — R12 Heartburn: Secondary | ICD-10-CM | POA: Insufficient documentation

## 2022-08-01 IMAGING — CT CT HEAD W/O CM
4 series · 17 of 47 positions shown, 19 images · non-contrast
Comparison: 03/12/2012

CLINICAL DATA: Posttraumatic headache

EXAM:
CT HEAD WITHOUT CONTRAST
CT CERVICAL SPINE WITHOUT CONTRAST
TECHNIQUE: Multidetector CT imaging of the head and cervical spine was
performed following the standard protocol without intravenous
contrast. Multiplanar CT image reconstructions of the cervical spine
were also generated.

[Series 2: head wo · axial · 0.48mm/px · z∈[-90,+40]mm · 7 of 36 slices shown, 9 images]
[im 5/36  brain]
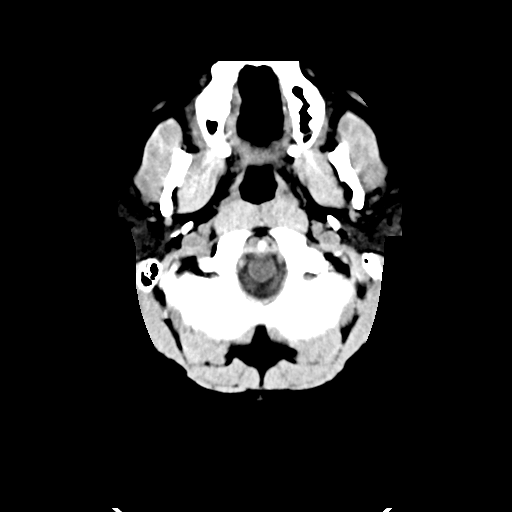
[im 5/36  bone]
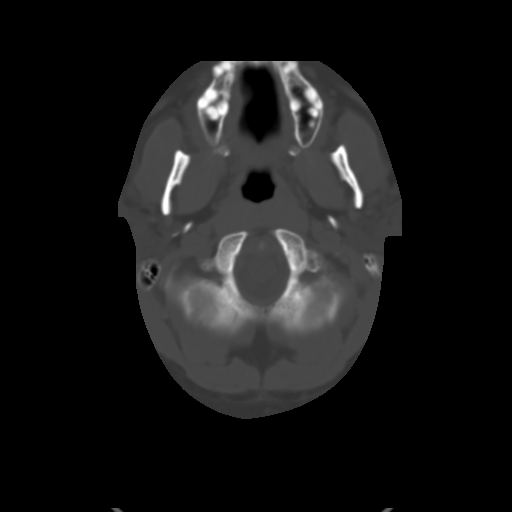
[im 9/36  brain]
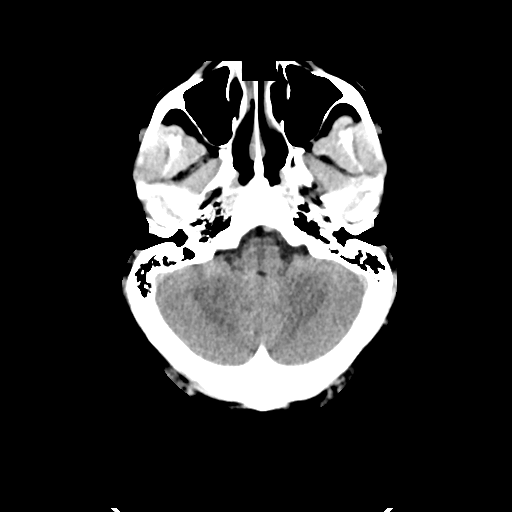
[im 14/36  brain]
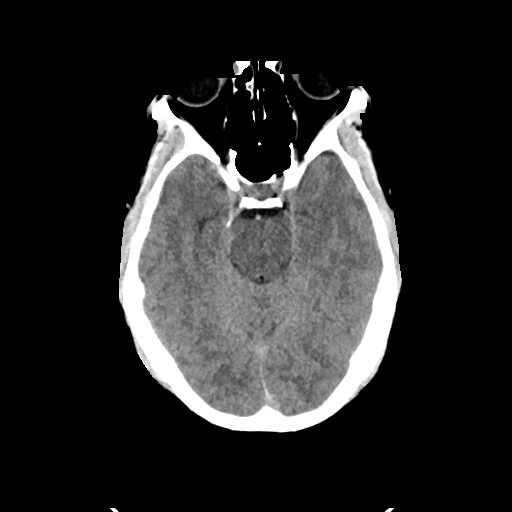
[im 18/36  brain]
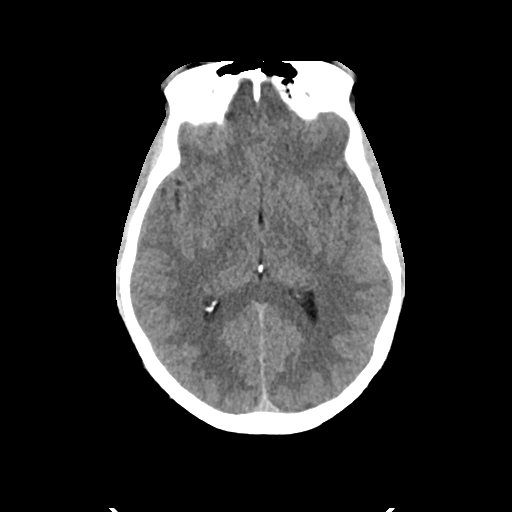
[im 22/36  brain]
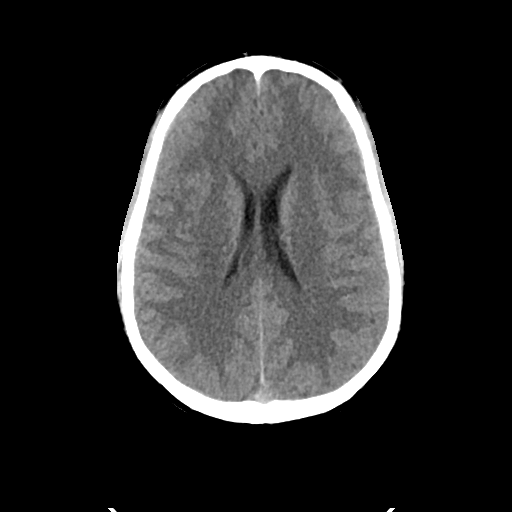
[im 22/36  bone]
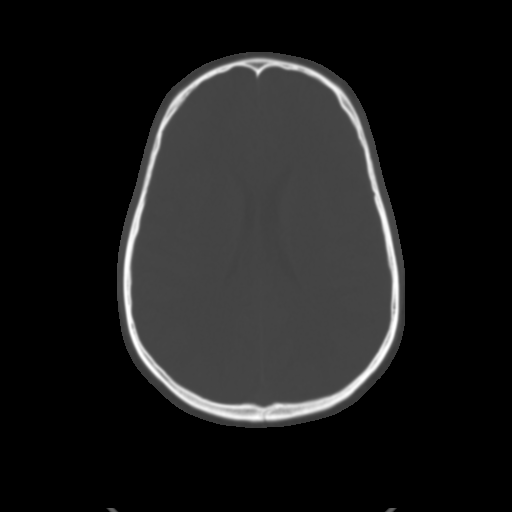
[im 27/36  brain]
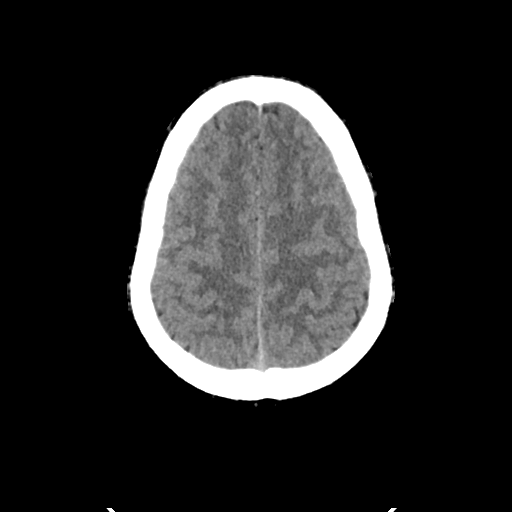
[im 31/36  brain]
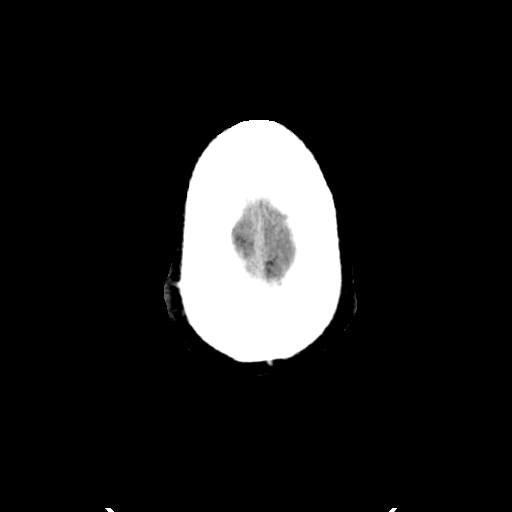

[Series 3: head bone · axial · 0.48mm/px · z∈[-94,-32]mm · 4 of 89 slices shown]
[im 9/89  bone]
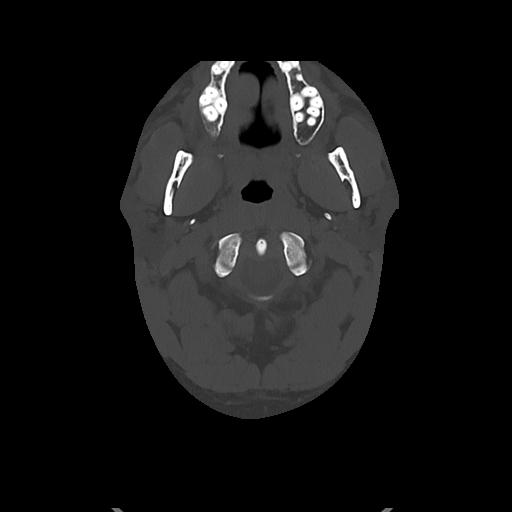
[im 18/89  bone]
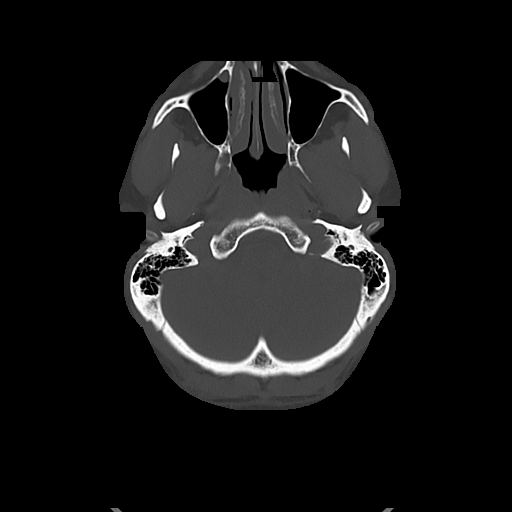
[im 27/89  bone]
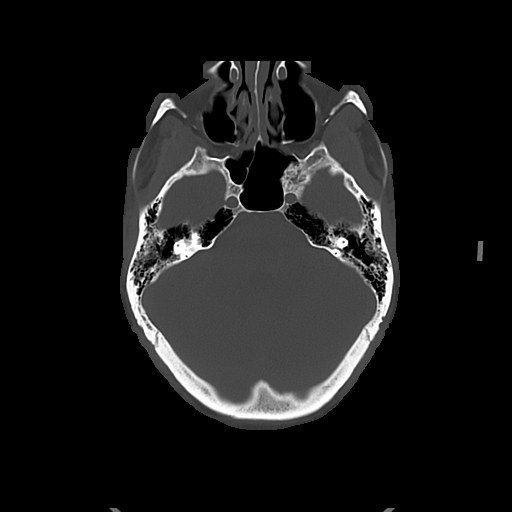
[im 40/89  bone]
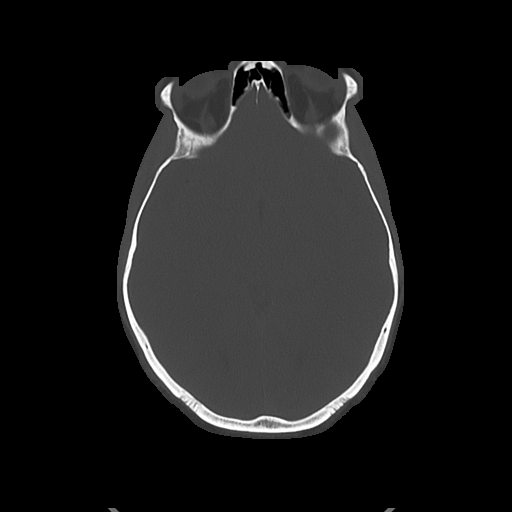

[Series 4: cor soft · coronal · 0.35mm/px · 3 of 74 slices shown]
[im 25/74  brain]
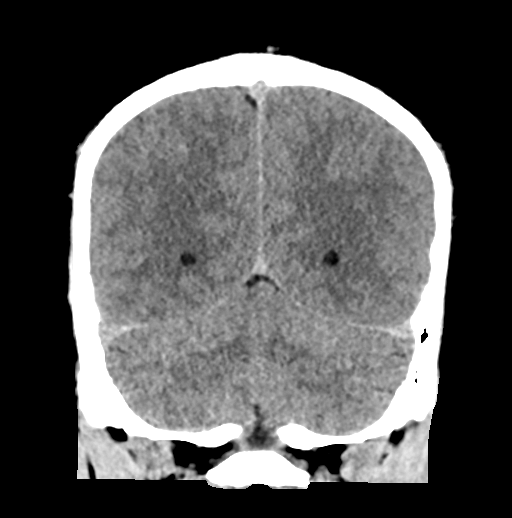
[im 33/74  brain]
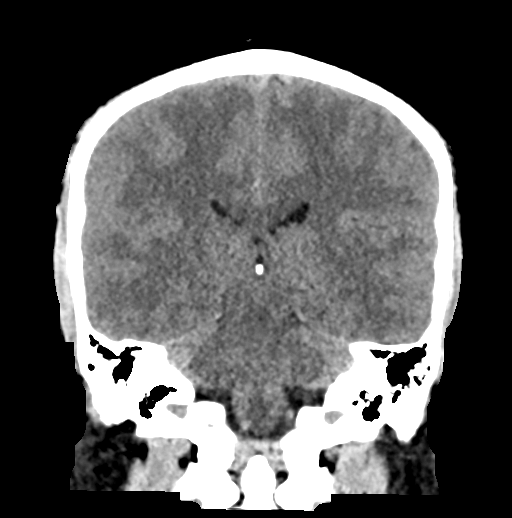
[im 41/74  brain]
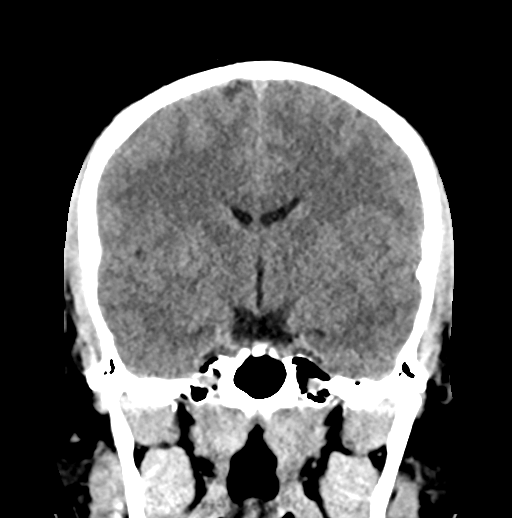

[Series 5: sag soft · sagittal · 0.36mm/px · 3 of 60 slices shown]
[im 20/60  brain]
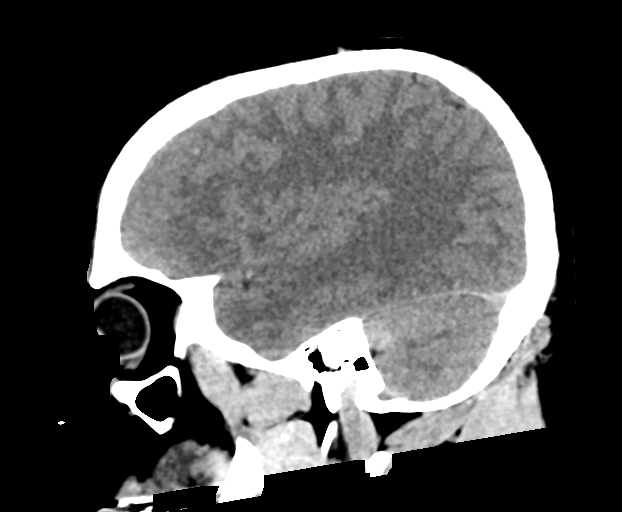
[im 30/60  brain]
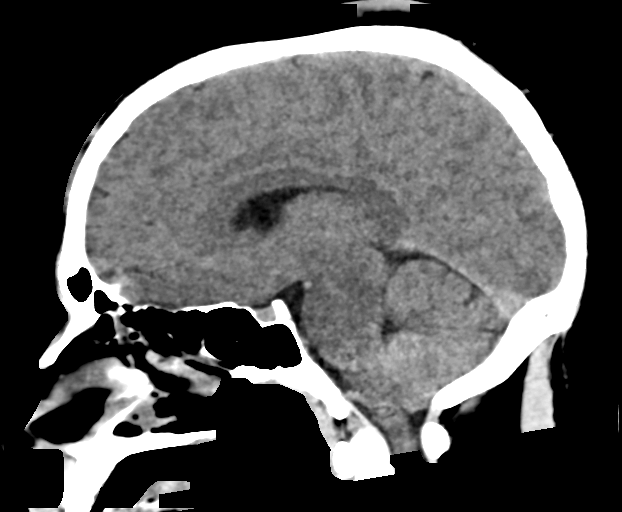
[im 40/60  brain]
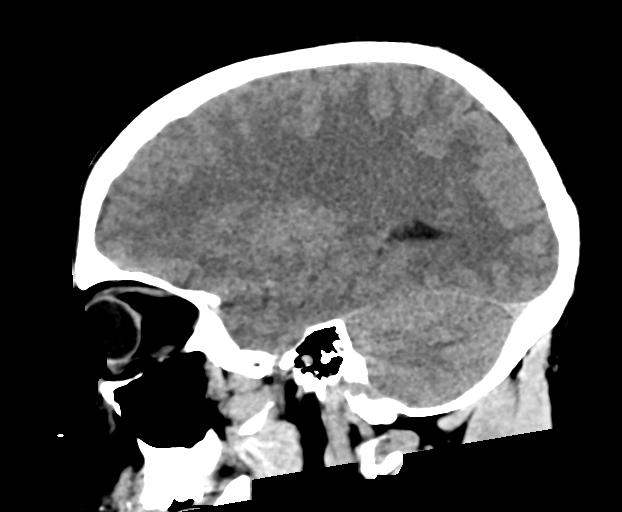

[17 of 47 positions shown; findings below may reference images not displayed]

FINDINGS: CT HEAD FINDINGS

Brain: No evidence of acute infarction, hemorrhage, hydrocephalus,
extra-axial collection or mass lesion/mass effect. Rare
visualization of CSF within sulci that is attributed to young age
and is stable from prior. No detected brain edema

Vascular: No hyperdense vessel or unexpected calcification.

Skull: Normal. Negative for fracture or focal lesion.

Sinuses/Orbits: No acute finding.

CT CERVICAL SPINE FINDINGS

Alignment: Normal.

Skull base and vertebrae: No acute fracture. No primary bone lesion
or focal pathologic process.

Soft tissues and spinal canal: No prevertebral fluid or swelling. No
visible canal hematoma.

Disc levels: No degenerative changes.

Upper chest: No evidence of injury
IMPRESSION: No evidence of intracranial or cervical spine injury.

## 2022-08-11 ENCOUNTER — Ambulatory Visit
Admission: EM | Admit: 2022-08-11 | Discharge: 2022-08-11 | Disposition: A | Discharge status: Home / Self Care | Payer: Medicaid Other | Attending: Physician Assistant | Admitting: Physician Assistant

## 2022-08-11 DIAGNOSIS — J069 Acute upper respiratory infection, unspecified: Secondary | ICD-10-CM

## 2022-08-11 DIAGNOSIS — H6501 Acute serous otitis media, right ear: Secondary | ICD-10-CM

## 2022-08-11 MED ORDER — BENZONATATE 200 MG PO CAPS: 200.0000 mg | ORAL_CAPSULE | Freq: Three times a day (TID) | ORAL | 0 refills | Status: DC | PRN

## 2022-08-11 MED ORDER — PROMETHAZINE-DM 6.25-15 MG/5ML PO SYRP: 5.0000 mL | ORAL_SOLUTION | Freq: Four times a day (QID) | ORAL | 0 refills | Status: DC | PRN

## 2022-08-11 MED ORDER — IPRATROPIUM BROMIDE 0.06 % NA SOLN: 2.0000 | Freq: Four times a day (QID) | NASAL | 0 refills | Status: DC

## 2022-08-11 NOTE — Discharge Instructions (Signed)
URI/COLD SYMPTOMS: Your exam today is consistent with a viral illness. Antibiotics are not indicated at this time. Use medications as directed, including cough syrup, nasal saline, and decongestants. Your symptoms should improve over the next few days and resolve within 7-10 days. Increase rest and fluids. F/u if symptoms worsen or predominate such as sore throat, ear pain, productive cough, shortness of breath, or if you develop high fevers or worsening fatigue over the next several days.    

## 2022-08-11 NOTE — ED Triage Notes (Signed)
Pt c/o cough, nasal congestion, ear pain  x5days

## 2022-08-11 NOTE — ED Provider Notes (Signed)
[facility]    CSN: [id number] Arrival date & time: 08/11/22  1702      History   Chief Complaint Chief Complaint  Patient presents with   Cough    HPI Robert Vazquez is a 32 y.o. male presenting for approximately 5-day history of cough, congestion, intermittent right ear pain.  Denies fever, sore throat, chest pain, shortness of breath, vomiting or diarrhea.  States he is feeling a little better than he was at onset.  Has been taking Mucinex.  Reports multiple sick contacts with the same sort of symptoms.  History of hypertension, otherwise healthy.  No other complaints.  HPI  Past Medical History:  Diagnosis Date   Hypertension     Patient Active Problem List   Diagnosis Date Noted   Heart burn 05/06/2022    History reviewed. No pertinent surgical history.     Home Medications    Prior to Admission medications   Medication Sig Start Date End Date Taking? Authorizing Provider  ipratropium (ATROVENT) 0.06 % nasal spray Place 2 sprays into both nostrils 4 (four) times daily. 08/11/22  Yes Shirlee Latch, PA-C  promethazine-dextromethorphan (PROMETHAZINE-DM) 6.25-15 MG/5ML syrup Take 5 mLs by mouth 4 (four) times daily as needed. 08/11/22  Yes Shirlee Latch, PA-C  meclizine (ANTIVERT) 25 MG tablet Take 1 tablet (25 mg total) by mouth 3 (three) times daily as needed for dizziness. 03/25/21   Arthor Captain, PA-C    Family History Family History  Problem Relation Age of Onset   CAD Father     Social History Social History   Tobacco Use   Smoking status: Never   Smokeless tobacco: Never  Vaping Use   Vaping Use: Never used  Substance Use Topics   Alcohol use: No   Drug use: No     Allergies   Patient has no known allergies.   Review of Systems Review of Systems  Constitutional:  Positive for fatigue. Negative for fever.  HENT:  Positive for congestion, ear pain and rhinorrhea. Negative for sinus pressure, sinus pain and sore throat.    Respiratory:  Positive for cough. Negative for shortness of breath.   Gastrointestinal:  Negative for abdominal pain, diarrhea, nausea and vomiting.  Musculoskeletal:  Negative for myalgias.  Neurological:  Negative for weakness, light-headedness and headaches.  Hematological:  Negative for adenopathy.     Physical Exam Triage Vital Signs ED Triage Vitals  Enc Vitals Group     BP      Pulse      Resp      Temp      Temp src      SpO2      Weight      Height      Head Circumference      Peak Flow      Pain Score      Pain Loc      Pain Edu?      Excl. in GC?    No data found.  Updated Vital Signs BP (!) 146/85 (BP Location: Left Arm)   Pulse 99   Temp 99.4 F (37.4 C) (Oral)   Ht 6\' 1"  (1.854 m)   Wt 240 lb (108.9 kg)   SpO2 92%   BMI 31.66 kg/m    Physical Exam Vitals and nursing note reviewed.  Constitutional:      General: He is not in acute distress.    Appearance: Normal appearance. He is well-developed. He is not  ill-appearing.  HENT:     Head: Normocephalic and atraumatic.     Right Ear: Ear canal and external ear normal. A middle ear effusion is present.     Left Ear: Tympanic membrane, ear canal and external ear normal.     Nose: Congestion present.     Mouth/Throat:     Mouth: Mucous membranes are moist.     Pharynx: Oropharynx is clear.  Eyes:     General: No scleral icterus.    Conjunctiva/sclera: Conjunctivae normal.  Cardiovascular:     Rate and Rhythm: Normal rate and regular rhythm.  Pulmonary:     Effort: Pulmonary effort is normal. No respiratory distress.     Breath sounds: Normal breath sounds.  Musculoskeletal:     Cervical back: Neck supple.  Skin:    General: Skin is warm and dry.     Capillary Refill: Capillary refill takes less than 2 seconds.  Neurological:     General: No focal deficit present.     Mental Status: He is alert. Mental status is at baseline.     Motor: No weakness.     Gait: Gait normal.  Psychiatric:         Mood and Affect: Mood normal.        Behavior: Behavior normal.      UC Treatments / Results  Labs (all labs ordered are listed, but only abnormal results are displayed) Labs Reviewed - No data to display  EKG   Radiology No results found.  Procedures Procedures (including critical care time)  Medications Ordered in UC Medications - No data to display  Initial Impression / Assessment and Plan / UC Course  I have reviewed the triage vital signs and the nursing notes.  Pertinent labs & imaging results that were available during my care of the patient were reviewed by me and considered in my medical decision making (see chart for details).   32 year old male presents for 5-day history of cough, congestion, intermittent right ear pain.  No fever or breathing difficulty.  Symptoms have been improving.  He is afebrile.  Overall well-appearing.  No acute distress.  Clear effusion of right TM.  Mild nasal congestion.  Throat clear.  Chest clear to auscultation.  Symptoms consistent with viral URI with improvement.  Sent Promethazine DM and Atrovent nasal spray to pharmacy.  Reviewed supportive care, return and ER precautions.   Final Clinical Impressions(s) / UC Diagnoses   Final diagnoses:  Viral URI with cough  Right acute serous otitis media, recurrence not specified     Discharge Instructions      URI/COLD SYMPTOMS: Your exam today is consistent with a viral illness. Antibiotics are not indicated at this time. Use medications as directed, including cough syrup, nasal saline, and decongestants. Your symptoms should improve over the next few days and resolve within 7-10 days. Increase rest and fluids. F/u if symptoms worsen or predominate such as sore throat, ear pain, productive cough, shortness of breath, or if you develop high fevers or worsening fatigue over the next several days.       ED Prescriptions     Medication Sig Dispense Auth. Provider    promethazine-dextromethorphan (PROMETHAZINE-DM) 6.25-15 MG/5ML syrup Take 5 mLs by mouth 4 (four) times daily as needed. 118 mL Eusebio Friendly B, PA-C   ipratropium (ATROVENT) 0.06 % nasal spray Place 2 sprays into both nostrils 4 (four) times daily. 15 mL Shirlee Latch, PA-C      PDMP not reviewed  this encounter.   Eusebio Friendly B, PA-C 08/11/22 1747

## 2022-09-07 ENCOUNTER — Emergency Department (HOSPITAL_BASED_OUTPATIENT_CLINIC_OR_DEPARTMENT_OTHER)
Admission: EM | Admit: 2022-09-07 | Discharge: 2022-09-08 | Disposition: A | Discharge status: Home / Self Care | Payer: Medicaid Other | Attending: Emergency Medicine | Admitting: Emergency Medicine

## 2022-09-07 ENCOUNTER — Encounter (HOSPITAL_BASED_OUTPATIENT_CLINIC_OR_DEPARTMENT_OTHER): Payer: Self-pay | Admitting: *Deleted

## 2022-09-07 ENCOUNTER — Other Ambulatory Visit: Payer: Self-pay

## 2022-09-07 DIAGNOSIS — R1031 Right lower quadrant pain: Secondary | ICD-10-CM | POA: Insufficient documentation

## 2022-09-07 LAB — URINALYSIS, ROUTINE W REFLEX MICROSCOPIC
Bacteria, UA: NONE SEEN
Bilirubin Urine: NEGATIVE
Glucose, UA: NEGATIVE mg/dL
Ketones, ur: NEGATIVE mg/dL
Leukocytes,Ua: NEGATIVE
Nitrite: NEGATIVE
Specific Gravity, Urine: 1.032 — ABNORMAL HIGH (ref 1.005–1.030)
pH: 5.5 (ref 5.0–8.0)

## 2022-09-07 LAB — CBC
HCT: 43.5 % (ref 39.0–52.0)
Hemoglobin: 15.1 g/dL (ref 13.0–17.0)
MCH: 31.9 pg (ref 26.0–34.0)
MCHC: 34.7 g/dL (ref 30.0–36.0)
MCV: 92 fL (ref 80.0–100.0)
Platelets: 291 10*3/uL (ref 150–400)
RBC: 4.73 MIL/uL (ref 4.22–5.81)
RDW: 12.6 % (ref 11.5–15.5)
WBC: 5.7 10*3/uL (ref 4.0–10.5)
nRBC: 0 % (ref 0.0–0.2)

## 2022-09-07 LAB — COMPREHENSIVE METABOLIC PANEL
ALT: 21 U/L (ref 0–44)
AST: 26 U/L (ref 15–41)
Albumin: 4.5 g/dL (ref 3.5–5.0)
Alkaline Phosphatase: 86 U/L (ref 38–126)
Anion gap: 8 (ref 5–15)
BUN: 8 mg/dL (ref 6–20)
CO2: 27 mmol/L (ref 22–32)
Calcium: 9.7 mg/dL (ref 8.9–10.3)
Chloride: 102 mmol/L (ref 98–111)
Creatinine, Ser: 1.18 mg/dL (ref 0.61–1.24)
GFR, Estimated: >60 mL/min (ref >60)
Glucose, Bld: 96 mg/dL (ref 70–99)
Potassium: 3.6 mmol/L (ref 3.5–5.1)
Sodium: 137 mmol/L (ref 135–145)
Total Bilirubin: 0.5 mg/dL (ref 0.3–1.2)
Total Protein: 7.8 g/dL (ref 6.5–8.1)

## 2022-09-07 LAB — LIPASE, BLOOD: Lipase: 33 U/L (ref 11–51)

## 2022-09-07 NOTE — ED Triage Notes (Signed)
Pt is here for lower abdominal pain which began yesterday.  No pain or burning with urination, LBM today, no n/v with this.  No fever

## 2022-09-08 ENCOUNTER — Emergency Department (HOSPITAL_BASED_OUTPATIENT_CLINIC_OR_DEPARTMENT_OTHER): Payer: Medicaid Other

## 2022-09-08 MED ORDER — IOHEXOL 300 MG/ML  SOLN
100.0000 mL | Freq: Once | INTRAMUSCULAR | Status: AC | PRN
  Administered 2022-09-08: 100 mL via INTRAVENOUS

## 2022-09-08 NOTE — ED Provider Notes (Signed)
[place] EM[facility]Y DEPARTMENT AT [facility]  Provider Note  CSN: [id number] Arrival date & time: 09/07/22 1957  History Chief Complaint  Patient presents with   Abdominal Pain    Robert Vazquez is a 32 y.o. male with no significant PMH reports 2 days of RLQ abdominal pain, no N/V/D or dysuria. Has not had much appetite today. No history of similar. No prior surgeries.    Home Medications Prior to Admission medications   Medication Sig Start Date End Date Taking? Authorizing Provider  benzonatate (TESSALON) 200 MG capsule Take 1 capsule (200 mg total) by mouth 3 (three) times daily as needed for cough. 08/11/22   Eusebio Friendly B, PA-C  ipratropium (ATROVENT) 0.06 % nasal spray Place 2 sprays into both nostrils 4 (four) times daily. 08/11/22   Shirlee Latch, PA-C  meclizine (ANTIVERT) 25 MG tablet Take 1 tablet (25 mg total) by mouth 3 (three) times daily as needed for dizziness. 03/25/21   Arthor Captain, PA-C  promethazine-dextromethorphan (PROMETHAZINE-DM) 6.25-15 MG/5ML syrup Take 5 mLs by mouth 4 (four) times daily as needed. 08/11/22   Shirlee Latch, PA-C     Allergies    Patient has no known allergies.   Review of Systems   Review of Systems Please see HPI for pertinent positives and negatives  Physical Exam BP 112/79   Pulse 65   Temp 98.2 F (36.8 C) (Oral)   Resp 18   SpO2 96%   Physical Exam Vitals and nursing note reviewed.  Constitutional:      Appearance: Normal appearance.  HENT:     Head: Normocephalic and atraumatic.     Nose: Nose normal.     Mouth/Throat:     Mouth: Mucous membranes are moist.  Eyes:     Extraocular Movements: Extraocular movements intact.     Conjunctiva/sclera: Conjunctivae normal.  Cardiovascular:     Rate and Rhythm: Normal rate.  Pulmonary:     Effort: Pulmonary effort is normal.     Breath sounds: Normal breath sounds.  Abdominal:     General: Abdomen is flat.     Palpations: Abdomen is soft.      Tenderness: There is abdominal tenderness in the right lower quadrant. There is no guarding. Positive signs include McBurney's sign. Negative signs include Murphy's sign and Rovsing's sign.  Musculoskeletal:        General: No swelling. Normal range of motion.     Cervical back: Neck supple.  Skin:    General: Skin is warm and dry.  Neurological:     General: No focal deficit present.     Mental Status: He is alert.  Psychiatric:        Mood and Affect: Mood normal.     ED Results / Procedures / Treatments   EKG None  Procedures Procedures  Medications Ordered in the ED Medications  iohexol (OMNIPAQUE) 300 MG/ML solution 100 mL (100 mLs Intravenous Contrast Given 09/08/22 0012)    Initial Impression and Plan  Patient here with RLQ abdominal pain, some tenderness but no peritoneal signs. Labs done in triage show normal CBC, CMP, Lipase and UA. Will send for CT to rule out appendicitis. Patient declines pain meds at this time.   ED Course   Clinical Course as of 09/08/22 0050  Tue Sep 08, 2022  4098 I personally viewed the images from radiology studies and agree with radiologist interpretation: CT is negative. Patient abdomen remains benign. Plan discharge, recommend he take OTC  pain medications if needed. RTED for worsening pain, fever, vomiting or any other concerns.  [CS]    Clinical Course User Index [CS] Pollyann Savoy, MD     MDM Rules/Calculators/A&P Medical Decision Making Given presenting complaint, I considered that admission might be necessary. After review of results from ED lab and/or imaging studies, admission to the hospital is not indicated at this time.    Problems Addressed: Right lower quadrant abdominal pain: acute illness or injury  Amount and/or Complexity of Data Reviewed Labs: ordered. Decision-making details documented in ED Course. Radiology: ordered and independent interpretation performed. Decision-making details documented in ED  Course.  Risk Prescription drug management. Decision regarding hospitalization.     Final Clinical Impression(s) / ED Diagnoses Final diagnoses:  Right lower quadrant abdominal pain    Rx / DC Orders ED Discharge Orders     None        Pollyann Savoy, MD 09/08/22 [phone]

## 2023-01-06 ENCOUNTER — Encounter (HOSPITAL_COMMUNITY): Payer: Self-pay

## 2023-01-06 ENCOUNTER — Ambulatory Visit (HOSPITAL_COMMUNITY)
Admission: EM | Admit: 2023-01-06 | Discharge: 2023-01-06 | Disposition: A | Discharge status: Home / Self Care | Payer: Medicaid Other | Attending: Internal Medicine | Admitting: Internal Medicine

## 2023-01-06 DIAGNOSIS — J069 Acute upper respiratory infection, unspecified: Secondary | ICD-10-CM | POA: Diagnosis not present

## 2023-01-06 MED ORDER — PROMETHAZINE-DM 6.25-15 MG/5ML PO SYRP: 5.0000 mL | ORAL_SOLUTION | Freq: Every evening | ORAL | 0 refills | Status: DC | PRN

## 2023-01-06 MED ORDER — GUAIFENESIN ER 1200 MG PO TB12: 1200.0000 mg | ORAL_TABLET | Freq: Two times a day (BID) | ORAL | 0 refills | Status: DC

## 2023-01-06 MED ORDER — BENZONATATE 100 MG PO CAPS: 100.0000 mg | ORAL_CAPSULE | Freq: Three times a day (TID) | ORAL | 0 refills | Status: DC

## 2023-01-06 NOTE — Discharge Instructions (Addendum)
You have a viral illness which will improve on its own with rest, fluids, and medications to help with your symptoms. We discussed prescriptions that may help with your symptoms: tessalon perles, promethazine DM You may use over the counter medicines as needed: tylenol/motrin, mucinex, zyrtec, Flonase Two teaspoons of honey in 1 cup of warm water every 4-6 hours may help with throat pains. Humidifier in room at nighttime may help soothe cough (clean well daily).   For chest pain, shortness of breath, inability to keep food or fluids down without vomiting, fever that does not respond to tylenol or motrin, or any other severe symptoms, please go to the ER for further evaluation. Return to urgent care as needed, otherwise follow-up with PCP.

## 2023-01-06 NOTE — ED Provider Notes (Signed)
[facility]    CSN: [id number] Arrival date & time: 01/06/23  2952      History   Chief Complaint Chief Complaint  Patient presents with   Cough    HPI Robert Vazquez is a 32 y.o. male.   Patient presents to urgent care for evaluation of cough, nasal congestion, generalized fatigue, body aches, and sore throat that started abruptly on Saturday, January 02, 2023 (4 days ago).  Cough is dry and nonproductive.  Reports intermittent shortness of breath when symptoms first started, this has resolved and improved.  Sore throat is triggered by swallowing and is mild.  States his whole household is sick including his children who are school aged.  He has not taken a COVID test.  Former smoker/vape user, quit 3 years ago.  Denies other drug use.  No history of chronic respiratory problems.  Denies chest pain, N/V/D, abdominal pain, rash, and fever.  Using Mucinex to help with symptoms with some relief.   Cough   Past Medical History:  Diagnosis Date   Hypertension     Patient Active Problem List   Diagnosis Date Noted   Heart burn 05/06/2022    History reviewed. No pertinent surgical history.     Home Medications    Prior to Admission medications   Medication Sig Start Date End Date Taking? Authorizing Provider  benzonatate (TESSALON) 100 MG capsule Take 1 capsule (100 mg total) by mouth every 8 (eight) hours. 01/06/23  Yes Carlisle Beers, FNP  Guaifenesin 1200 MG TB12 Take 1 tablet (1,200 mg total) by mouth in the morning and at bedtime. 01/06/23  Yes Carlisle Beers, FNP  promethazine-dextromethorphan (PROMETHAZINE-DM) 6.25-15 MG/5ML syrup Take 5 mLs by mouth at bedtime as needed for cough. 01/06/23  Yes Carlisle Beers, FNP  ipratropium (ATROVENT) 0.06 % nasal spray Place 2 sprays into both nostrils 4 (four) times daily. 08/11/22   Shirlee Latch, PA-C  meclizine (ANTIVERT) 25 MG tablet Take 1 tablet (25 mg total) by mouth 3 (three) times daily  as needed for dizziness. 03/25/21   Arthor Captain, PA-C    Family History Family History  Problem Relation Age of Onset   CAD Father     Social History Social History   Tobacco Use   Smoking status: Former    Types: Cigarettes   Smokeless tobacco: Never  Vaping Use   Vaping status: Never Used  Substance Use Topics   Alcohol use: Yes    Comment: socially   Drug use: No     Allergies   Patient has no known allergies.   Review of Systems Review of Systems  Respiratory:  Positive for cough.   Per HPI   Physical Exam Triage Vital Signs ED Triage Vitals  Encounter Vitals Group     BP 01/06/23 0844 134/85     Systolic BP Percentile --      Diastolic BP Percentile --      Pulse Rate 01/06/23 0844 72     Resp 01/06/23 0844 16     Temp 01/06/23 0844 98.2 F (36.8 C)     Temp Source 01/06/23 0844 Oral     SpO2 01/06/23 0844 96 %     Weight 01/06/23 0844 256 lb (116.1 kg)     Height 01/06/23 0844 6\' 1"  (1.854 m)     Head Circumference --      Peak Flow --      Pain Score 01/06/23 0843 4  Pain Loc --      Pain Education --      Exclude from Growth Chart --    No data found.  Updated Vital Signs BP 134/85 (BP Location: Left Arm)   Pulse 72   Temp 98.2 F (36.8 C) (Oral)   Resp 16   Ht 6\' 1"  (1.854 m)   Wt 256 lb (116.1 kg)   SpO2 96%   BMI 33.78 kg/m   Visual Acuity Right Eye Distance:   Left Eye Distance:   Bilateral Distance:    Right Eye Near:   Left Eye Near:    Bilateral Near:     Physical Exam Vitals and nursing note reviewed.  Constitutional:      Appearance: He is not ill-appearing or toxic-appearing.  HENT:     Head: Normocephalic and atraumatic.     Right Ear: Hearing, tympanic membrane, ear canal and external ear normal.     Left Ear: Hearing, tympanic membrane, ear canal and external ear normal.     Nose: Congestion present.     Mouth/Throat:     Lips: Pink.     Mouth: Mucous membranes are moist. No injury.     Tongue: No  lesions. Tongue does not deviate from midline.     Palate: No mass and lesions.     Pharynx: Oropharynx is clear. Uvula midline. No pharyngeal swelling, oropharyngeal exudate, posterior oropharyngeal erythema or uvula swelling.     Tonsils: No tonsillar exudate or tonsillar abscesses.  Eyes:     General: Lids are normal. Vision grossly intact. Gaze aligned appropriately.     Extraocular Movements: Extraocular movements intact.     Conjunctiva/sclera: Conjunctivae normal.  Cardiovascular:     Rate and Rhythm: Normal rate and regular rhythm.     Heart sounds: Normal heart sounds, S1 normal and S2 normal.  Pulmonary:     Effort: Pulmonary effort is normal. No respiratory distress.     Breath sounds: Normal breath sounds and air entry.  Musculoskeletal:     Cervical back: Neck supple.  Skin:    General: Skin is warm and dry.     Capillary Refill: Capillary refill takes less than 2 seconds.     Findings: No rash.  Neurological:     General: No focal deficit present.     Mental Status: He is alert and oriented to person, place, and time. Mental status is at baseline.     Cranial Nerves: No dysarthria or facial asymmetry.  Psychiatric:        Mood and Affect: Mood normal.        Speech: Speech normal.        Behavior: Behavior normal.        Thought Content: Thought content normal.        Judgment: Judgment normal.      UC Treatments / Results  Labs (all labs ordered are listed, but only abnormal results are displayed) Labs Reviewed - No data to display  EKG   Radiology No results found.  Procedures Procedures (including critical care time)  Medications Ordered in UC Medications - No data to display  Initial Impression / Assessment and Plan / UC Course  I have reviewed the triage vital signs and the nursing notes.  Pertinent labs & imaging results that were available during my care of the patient were reviewed by me and considered in my medical decision making (see  chart for details).   1.  Viral URI with cough Suspect  viral URI, viral syndrome. Physical exam findings reassuring, vital signs hemodynamically stable. Low suspicion for pneumonia/acute cardiopulmonary abnormality, therefore deferred imaging of the chest. Advised supportive care, offered prescriptions for symptomatic relief.  Recommend continued use of OTC medications as needed, recommendations discussed with patient/caregiver and outlined in AVS.  Strep/viral testing: Deferred viral testing using shared decision making as patient is low risk for severe disease related to potential COVID-19 illness and there is low suspicion for influenza. Discussed updated CDC guidelines for masking related to COVID-19.    Counseled patient on potential for adverse effects with medications prescribed/recommended today, strict ER and return-to-clinic precautions discussed, patient verbalized understanding.   Final Clinical Impressions(s) / UC Diagnoses   Final diagnoses:  Viral URI with cough     Discharge Instructions      You have a viral illness which will improve on its own with rest, fluids, and medications to help with your symptoms. We discussed prescriptions that may help with your symptoms: tessalon perles, promethazine DM You may use over the counter medicines as needed: tylenol/motrin, mucinex, zyrtec, Flonase Two teaspoons of honey in 1 cup of warm water every 4-6 hours may help with throat pains. Humidifier in room at nighttime may help soothe cough (clean well daily).   For chest pain, shortness of breath, inability to keep food or fluids down without vomiting, fever that does not respond to tylenol or motrin, or any other severe symptoms, please go to the ER for further evaluation. Return to urgent care as needed, otherwise follow-up with PCP.     ED Prescriptions     Medication Sig Dispense Auth. Provider   benzonatate (TESSALON) 100 MG capsule Take 1 capsule (100 mg total) by mouth  every 8 (eight) hours. 21 capsule Reita May M, FNP   Guaifenesin 1200 MG TB12 Take 1 tablet (1,200 mg total) by mouth in the morning and at bedtime. 14 tablet Carlisle Beers, FNP   promethazine-dextromethorphan (PROMETHAZINE-DM) 6.25-15 MG/5ML syrup Take 5 mLs by mouth at bedtime as needed for cough. 118 mL Carlisle Beers, FNP      PDMP not reviewed this encounter.   Carlisle Beers, [place] 01/06/23 [phone]

## 2023-01-06 NOTE — ED Triage Notes (Signed)
Patient here today with c/o cough, congestion, nasal drainage, wheeze, and dizziness X 3 days. He has been taking Mucinex with some improvement. Everone is his household is also sick.

## 2023-04-10 IMAGING — CR DG CHEST 2V
2 series · 2 of 2 positions shown · non-contrast
Comparison: 11/07/2020

CLINICAL DATA: Pt complained of a productive cough x 1 month and
was diagnosed with acute bronchitis and viral infection yesterday [REDACTED] and prescribed doxy and prednisone. Pt here today because of
chest pain when coughing. Hx of HTN.

EXAM:
CHEST - 2 VIEW

[w chest pa]
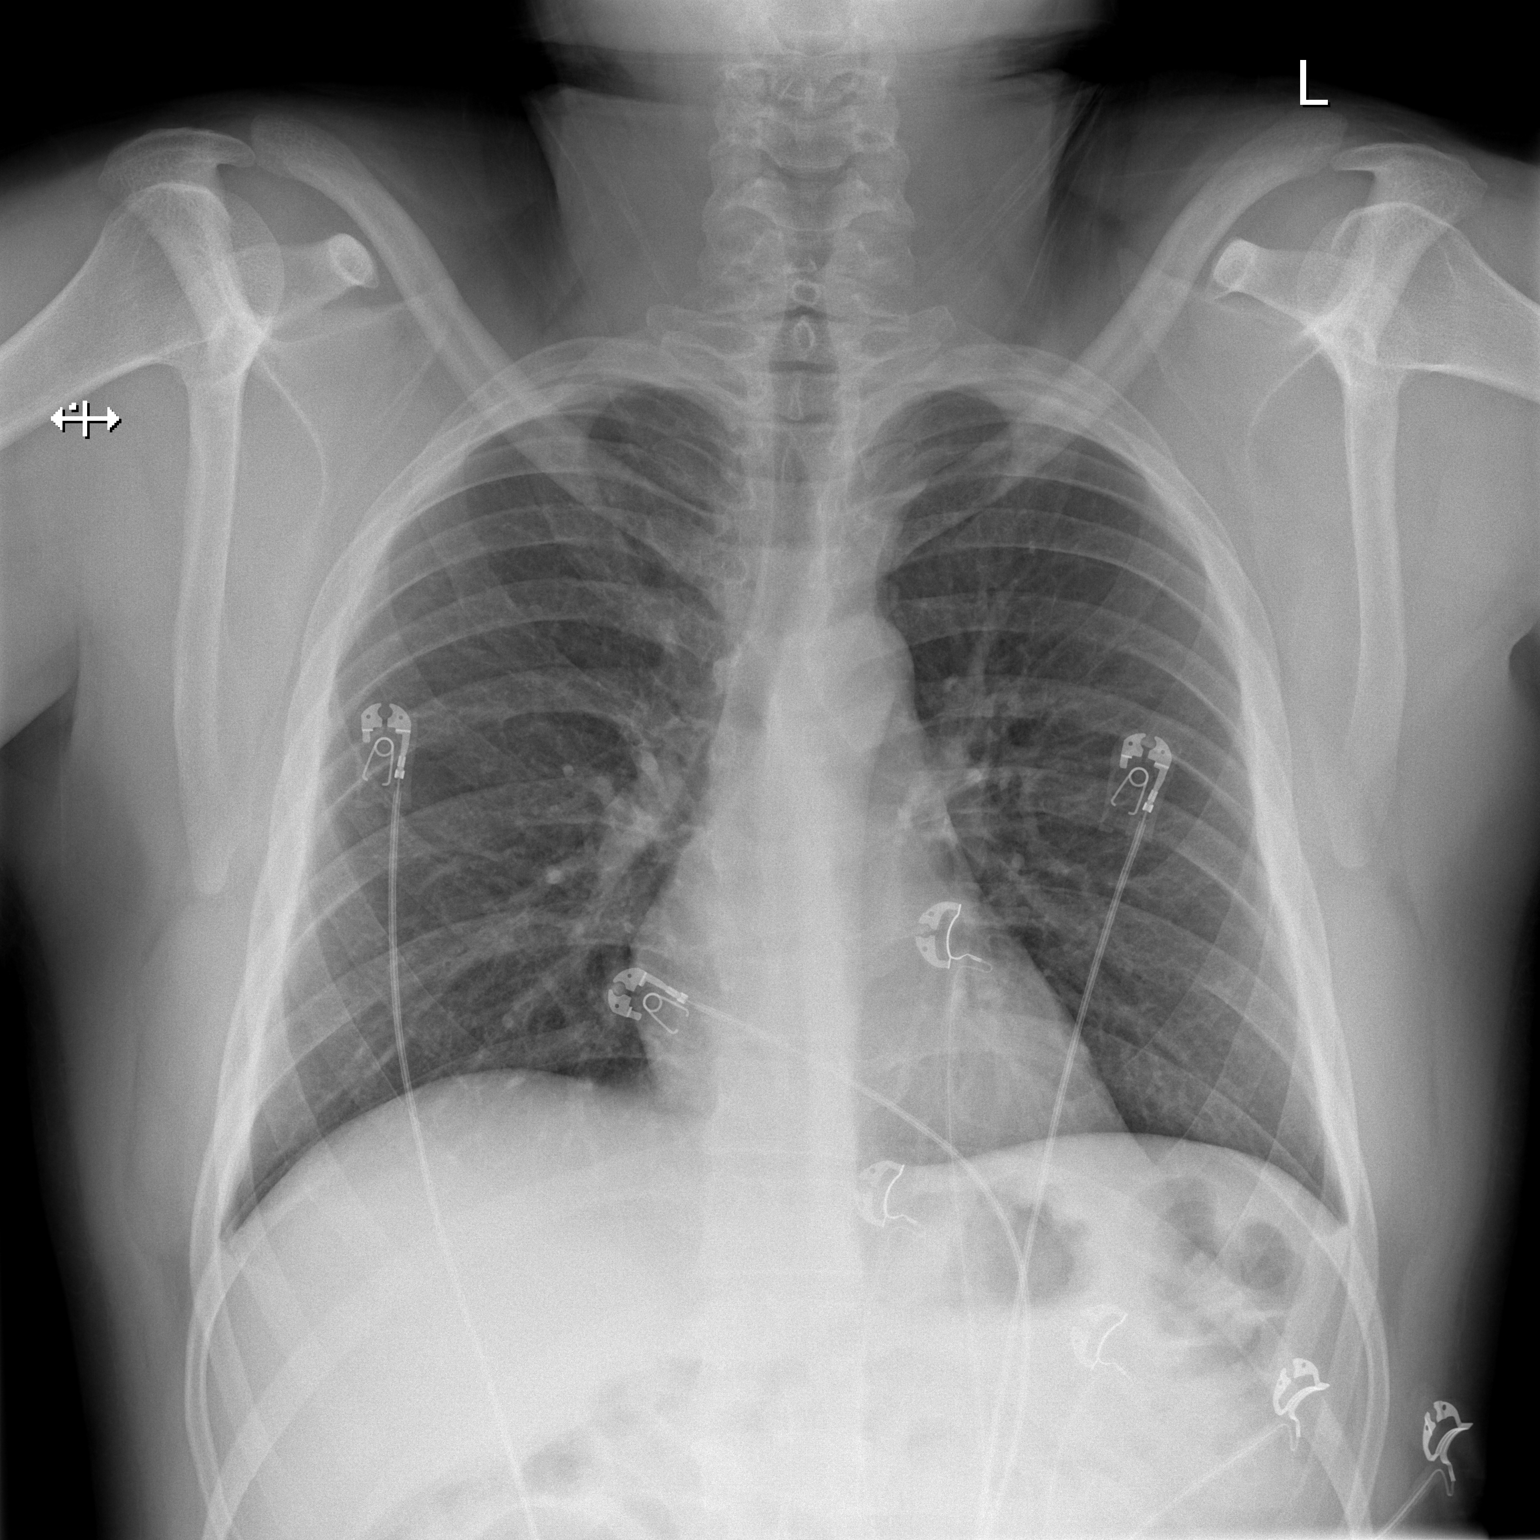

[w chest lat]
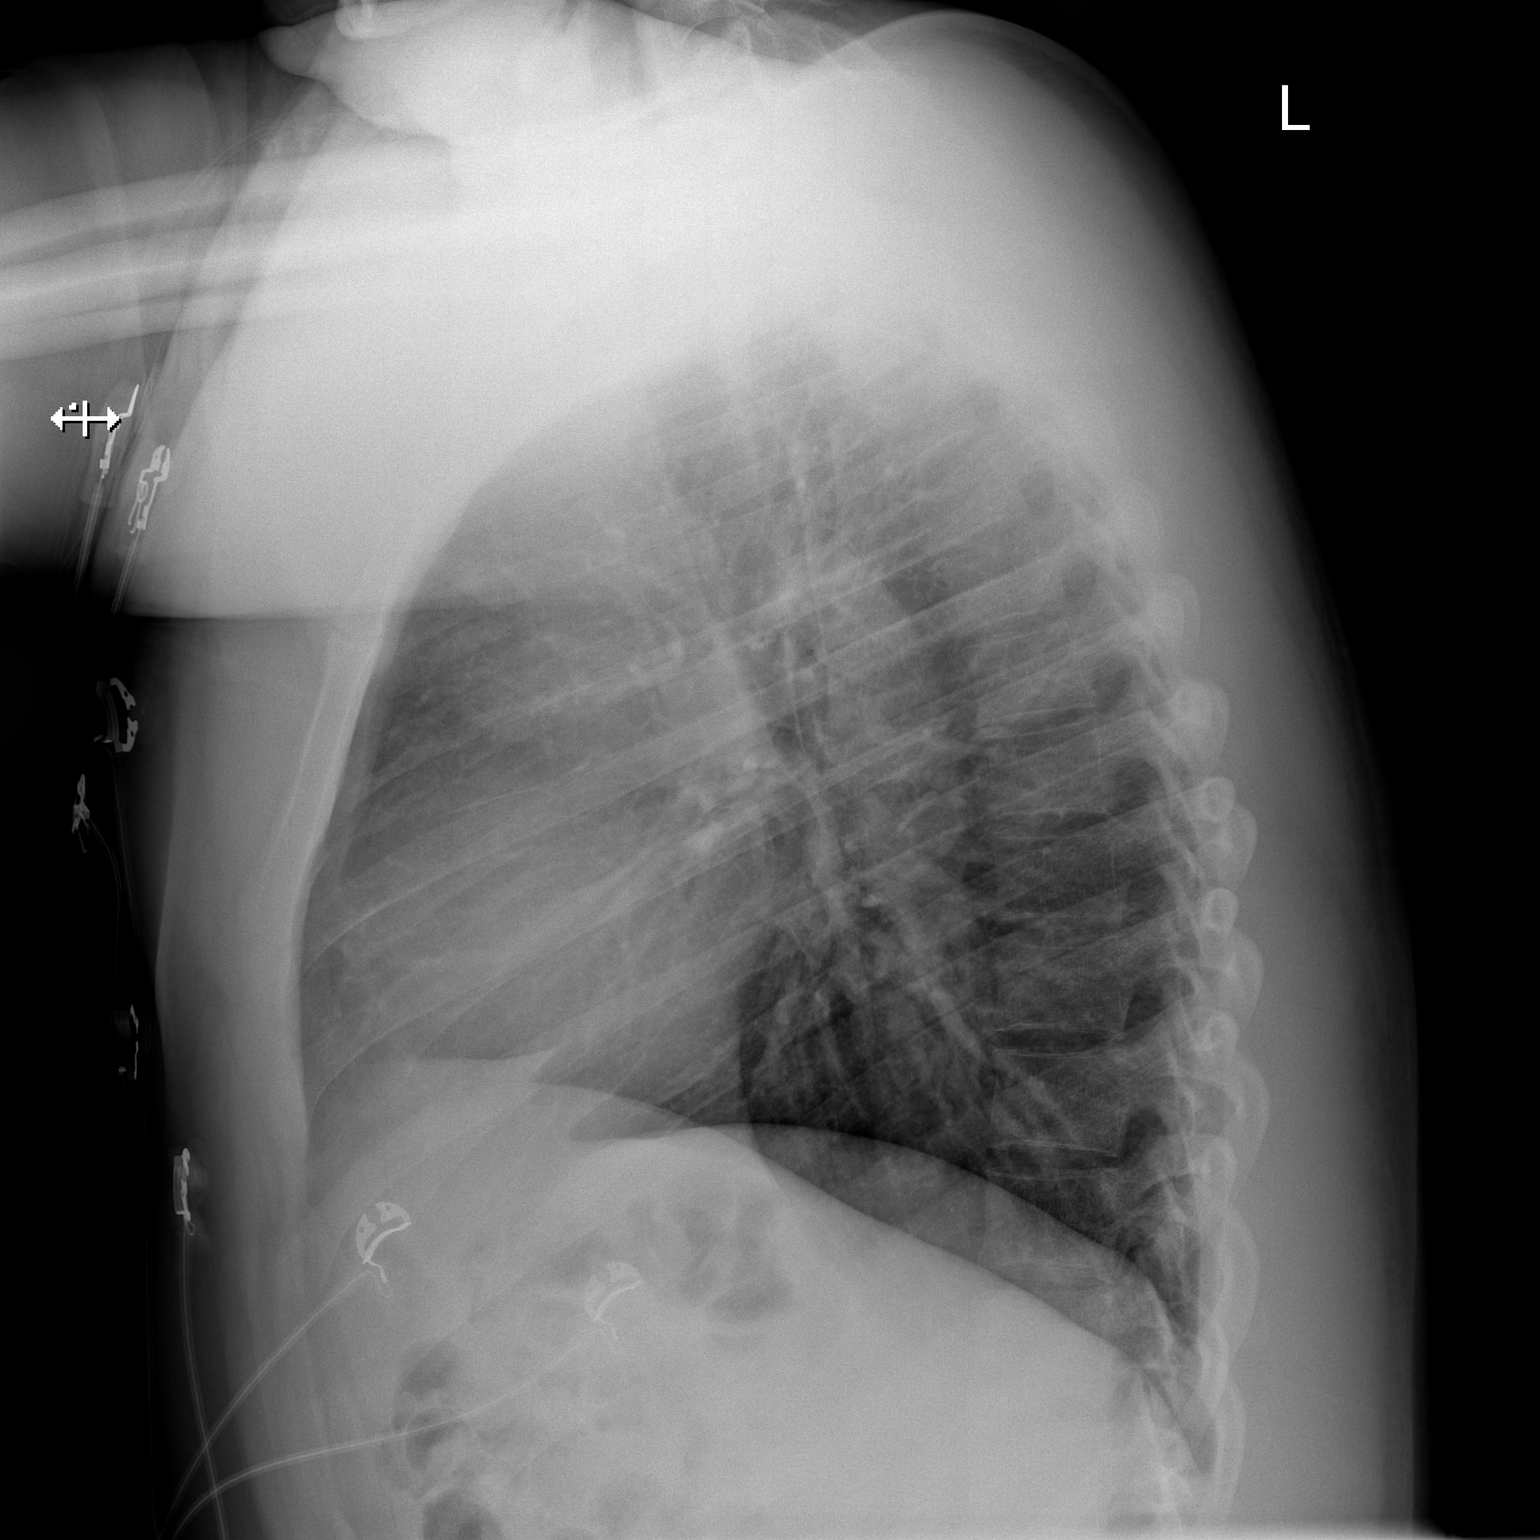

[2 of 2 positions shown; findings below may reference images not displayed]

FINDINGS: Normal heart, mediastinum and hila.

Clear lungs.  No pleural effusion or pneumothorax.

Skeletal structures are within normal limits.
IMPRESSION: Normal chest radiographs.

## 2023-04-15 ENCOUNTER — Ambulatory Visit: Payer: Medicaid Other | Admitting: Nurse Practitioner

## 2023-04-18 ENCOUNTER — Other Ambulatory Visit: Payer: Self-pay

## 2023-04-18 ENCOUNTER — Emergency Department
Admission: EM | Admit: 2023-04-18 | Discharge: 2023-04-18 | Disposition: A | Discharge status: Home / Self Care | Payer: Medicaid Other | Attending: Emergency Medicine | Admitting: Emergency Medicine

## 2023-04-18 DIAGNOSIS — R197 Diarrhea, unspecified: Secondary | ICD-10-CM | POA: Diagnosis present

## 2023-04-18 DIAGNOSIS — K529 Noninfective gastroenteritis and colitis, unspecified: Secondary | ICD-10-CM | POA: Insufficient documentation

## 2023-04-18 LAB — GASTROINTESTINAL PANEL BY PCR, STOOL (REPLACES STOOL CULTURE)

## 2023-04-18 LAB — MAGNESIUM: Magnesium: 1.9 mg/dL (ref 1.7–2.4)

## 2023-04-18 LAB — COMPREHENSIVE METABOLIC PANEL
ALT: 30 U/L (ref 0–44)
AST: 32 U/L (ref 15–41)
Albumin: 4.7 g/dL (ref 3.5–5.0)
Alkaline Phosphatase: 93 U/L (ref 38–126)
Anion gap: 12 (ref 5–15)
BUN: 11 mg/dL (ref 6–20)
CO2: 23 mmol/L (ref 22–32)
Calcium: 9.3 mg/dL (ref 8.9–10.3)
Chloride: 103 mmol/L (ref 98–111)
Creatinine, Ser: 1.13 mg/dL (ref 0.61–1.24)
GFR, Estimated: >60 mL/min (ref >60)
Glucose, Bld: 103 mg/dL — ABNORMAL HIGH (ref 70–99)
Potassium: 3.9 mmol/L (ref 3.5–5.1)
Sodium: 138 mmol/L (ref 135–145)
Total Bilirubin: 0.8 mg/dL (ref 0.0–1.2)
Total Protein: 8.5 g/dL — ABNORMAL HIGH (ref 6.5–8.1)

## 2023-04-18 LAB — CBC WITH DIFFERENTIAL/PLATELET
Abs Immature Granulocytes: 0.02 10*3/uL (ref 0.00–0.07)
Basophils Absolute: 0 10*3/uL (ref 0.0–0.1)
Basophils Relative: 0 %
Eosinophils Absolute: 0 10*3/uL (ref 0.0–0.5)
Eosinophils Relative: 1 %
HCT: 48.2 % (ref 39.0–52.0)
Hemoglobin: 16.2 g/dL (ref 13.0–17.0)
Immature Granulocytes: 0 %
Lymphocytes Relative: 6 %
Lymphs Abs: 0.5 10*3/uL — ABNORMAL LOW (ref 0.7–4.0)
MCH: 31.9 pg (ref 26.0–34.0)
MCHC: 33.6 g/dL (ref 30.0–36.0)
MCV: 94.9 fL (ref 80.0–100.0)
Monocytes Absolute: 0.3 10*3/uL (ref 0.1–1.0)
Monocytes Relative: 3 %
Neutro Abs: 7.1 10*3/uL (ref 1.7–7.7)
Neutrophils Relative %: 90 %
Platelets: 320 10*3/uL (ref 150–400)
RBC: 5.08 MIL/uL (ref 4.22–5.81)
RDW: 12.8 % (ref 11.5–15.5)
WBC: 7.9 10*3/uL (ref 4.0–10.5)
nRBC: 0 % (ref 0.0–0.2)

## 2023-04-18 MED ORDER — MORPHINE SULFATE (PF) 4 MG/ML IV SOLN
4.0000 mg | Freq: Once | INTRAVENOUS | Status: AC
  Administered 2023-04-18: 4 mg via INTRAVENOUS
  Filled 2023-04-18: qty 1

## 2023-04-18 MED ORDER — ONDANSETRON 4 MG PO TBDP: 4.0000 mg | ORAL_TABLET | Freq: Three times a day (TID) | ORAL | 0 refills | Status: DC | PRN

## 2023-04-18 MED ORDER — SODIUM CHLORIDE 0.9 % IV SOLN: Freq: Once | INTRAVENOUS | Status: AC

## 2023-04-18 MED ORDER — ONDANSETRON HCL 4 MG/2ML IJ SOLN
4.0000 mg | Freq: Once | INTRAMUSCULAR | Status: AC
  Administered 2023-04-18: 4 mg via INTRAVENOUS
  Filled 2023-04-18: qty 2

## 2023-04-18 NOTE — ED Triage Notes (Signed)
 Pt c/o n/v/d since last night. Everyone in the house has similar sx. Pt says he is feeling dizzy when he stands to walk. Pt reports 5 episodes of diarrhea and 4 episodes of emesis this morning. Pt took immodium at home without relief.

## 2023-04-18 NOTE — ED Provider Triage Note (Signed)
 Emergency Medicine Provider Triage Evaluation Note  Robert Vazquez , Vazquez 34 y.o. male  was evaluated in triage.  Pt complains of N/V/D since last night. Endorses sick contacts at home with same symptoms. No rectal bleeding, fever, chest pain or SOB  Review of Systems  Positive:  Negative:   Physical Exam  BP (!) 138/93   Pulse (!) 103   Temp 98.5 F (36.9 C) (Oral)   Resp 19   Ht 6' 1 (1.854 m)   Wt 110.4 kg   SpO2 95%   BMI 32.11 kg/m  Gen:   Awake, no distress   Resp:  Normal effort  MSK:   Moves extremities without difficulty  Other:    Medical Decision Making  Medically screening exam initiated at 3:50 PM.  Appropriate orders placed.  Robert Vazquez was informed that the remainder of the evaluation will be completed by another provider, this initial triage assessment does not replace that evaluation, and the importance of remaining in the ED until their evaluation is complete.    Robert Vazquez, Robert Nantz A, PA-C 04/18/23 1608

## 2023-04-18 NOTE — ED Provider Notes (Signed)
 [facility] Provider Note    Event Date/Time   First MD Initiated Contact with Patient 04/18/23 1755     (approximate)   History   Emesis and Diarrhea   HPI  Robert Vazquez is a 33 y.o. male who presents with nausea vomiting diarrhea, abdominal cramping which began yesterday.  He sometimes feels lightheaded when he stands up.  He reports the whole family has had similar symptoms but his seem worse.     Physical Exam   Triage Vital Signs: ED Triage Vitals  Encounter Vitals Group     BP 04/18/23 1548 (!) 138/93     Systolic BP Percentile --      Diastolic BP Percentile --      Pulse Rate 04/18/23 1548 (!) 103     Resp 04/18/23 1548 19     Temp 04/18/23 1548 98.5 F (36.9 C)     Temp Source 04/18/23 1548 Oral     SpO2 04/18/23 1548 95 %     Weight 04/18/23 1549 110.4 kg (243 lb 6.2 oz)     Height 04/18/23 1549 1.854 m (6' 1)     Head Circumference --      Peak Flow --      Pain Score 04/18/23 1549 8     Pain Loc --      Pain Education --      Exclude from Growth Chart --     Most recent vital signs: Vitals:   04/18/23 1548  BP: (!) 138/93  Pulse: (!) 103  Resp: 19  Temp: 98.5 F (36.9 C)  SpO2: 95%     General: Awake, no distress.  CV:  Good peripheral perfusion.  Resp:  Normal effort.  Abd:  No distention.  Soft, nontender, reassuring exam Other:     ED Results / Procedures / Treatments   Labs (all labs ordered are listed, but only abnormal results are displayed) Labs Reviewed  GASTROINTESTINAL PANEL BY PCR, STOOL (REPLACES STOOL CULTURE) - Abnormal; Notable for the following components:      Result Value   Norovirus GI/GII DETECTED (*)    All other components within normal limits  CBC WITH DIFFERENTIAL/PLATELET - Abnormal; Notable for the following components:   Lymphs Abs 0.5 (*)    All other components within normal limits  COMPREHENSIVE METABOLIC PANEL - Abnormal; Notable for the following components:   Glucose,  Bld 103 (*)    Total Protein 8.5 (*)    All other components within normal limits  MAGNESIUM     EKG    RADIOLOGY     PROCEDURES:  Critical Care performed:   Procedures   MEDICATIONS ORDERED IN ED: Medications  0.9 %  sodium chloride  infusion ( Intravenous New Bag/Given 04/18/23 1819)  ondansetron  (ZOFRAN ) injection 4 mg (4 mg Intravenous Given 04/18/23 1818)  morphine  (PF) 4 MG/ML injection 4 mg (4 mg Intravenous Given 04/18/23 1818)     IMPRESSION / MDM / ASSESSMENT AND PLAN / ED COURSE  I reviewed the triage vital signs and the nursing notes. Patient's presentation is most consistent with acute illness / injury with system symptoms.  Patient presents with nausea vomiting diarrhea as described above, highly suspicious for viral gastroenteritis which is prevalent in community this time.  Abdominal exam is overall reassuring.  Lab work reviewed and is generally reassuring.  GI panel confirms positive norovirus will treat with IV fluids, IV Zofran , IV morphine , IV fluids and reevaluate.  Patient feeling much  better after treatment, appropriate for discharge with outpatient follow-up, return precautions discussed      FINAL CLINICAL IMPRESSION(S) / ED DIAGNOSES   Final diagnoses:  Gastroenteritis     Rx / DC Orders   ED Discharge Orders          Ordered    ondansetron  (ZOFRAN -ODT) 4 MG disintegrating tablet  Every 8 hours PRN        04/18/23 1914             Note:  This document was prepared using Dragon voice recognition software and may include unintentional dictation errors.   Arlander Charleston, MD 04/18/23 [phone]

## 2023-05-19 ENCOUNTER — Encounter (HOSPITAL_COMMUNITY): Payer: Self-pay

## 2023-05-19 ENCOUNTER — Emergency Department (HOSPITAL_COMMUNITY)
Admission: EM | Admit: 2023-05-19 | Discharge: 2023-05-19 | Disposition: A | Discharge status: Home / Self Care | Payer: Medicaid Other | Attending: Emergency Medicine | Admitting: Emergency Medicine

## 2023-05-19 ENCOUNTER — Other Ambulatory Visit: Payer: Self-pay

## 2023-05-19 ENCOUNTER — Emergency Department (HOSPITAL_COMMUNITY): Payer: Medicaid Other

## 2023-05-19 DIAGNOSIS — Z20822 Contact with and (suspected) exposure to covid-19: Secondary | ICD-10-CM | POA: Insufficient documentation

## 2023-05-19 DIAGNOSIS — I10 Essential (primary) hypertension: Secondary | ICD-10-CM | POA: Insufficient documentation

## 2023-05-19 DIAGNOSIS — R509 Fever, unspecified: Secondary | ICD-10-CM | POA: Diagnosis present

## 2023-05-19 DIAGNOSIS — J101 Influenza due to other identified influenza virus with other respiratory manifestations: Secondary | ICD-10-CM | POA: Insufficient documentation

## 2023-05-19 LAB — RESP PANEL BY RT-PCR (RSV, FLU A&B, COVID)  RVPGX2
Influenza A by PCR: POSITIVE — AB
Influenza B by PCR: NEGATIVE
Resp Syncytial Virus by PCR: NEGATIVE
SARS Coronavirus 2 by RT PCR: NEGATIVE

## 2023-05-19 MED ORDER — ACETAMINOPHEN 500 MG PO TABS
1000.0000 mg | ORAL_TABLET | Freq: Once | ORAL | Status: AC
  Administered 2023-05-19: 1000 mg via ORAL
  Filled 2023-05-19: qty 2

## 2023-05-19 MED ORDER — ONDANSETRON 4 MG PO TBDP
4.0000 mg | ORAL_TABLET | Freq: Once | ORAL | Status: AC
  Administered 2023-05-19: 4 mg via ORAL
  Filled 2023-05-19: qty 1

## 2023-05-19 MED ORDER — ONDANSETRON 4 MG PO TBDP: 4.0000 mg | ORAL_TABLET | Freq: Three times a day (TID) | ORAL | 0 refills | Status: DC | PRN

## 2023-05-19 NOTE — Discharge Instructions (Addendum)
You have the flu.  He you are going to most likely feel unwell for the next 3-5 more days.  Take Tylenol or ibuprofen every 6 hours for fever and bodyaches.  Make sure you are drinking plenty of fluids and getting lots of rest.

## 2023-05-19 NOTE — ED Triage Notes (Signed)
Complains of cough, generalized body aches and pain in his chest when he coughs.  Also has nasal congestion.

## 2023-05-19 NOTE — ED Provider Notes (Signed)
[place] EMERGENCY DEPARTMENT AT [facility] Provider Note   CSN: [id number] Arrival date & time: 05/19/23  2130     History  Chief Complaint  Patient presents with   Cough    THERMON ZULAUF is a 33 y.o. male.  Patient is a 33 year old male with a history of hypertension presenting today with 5-day history of generalized fatigue, myalgias, cough, congestion, nausea, vomiting and diarrhea.  He reports he feels generally unwell.  Feels a bit lightheaded with standing.  He has also had fever.  Denies receiving a flu shot this year.  No specific shortness of breath.  The history is provided by the patient.  Cough      Home Medications Prior to Admission medications   Medication Sig Start Date End Date Taking? Authorizing Provider  ondansetron (ZOFRAN-ODT) 4 MG disintegrating tablet Take 1 tablet (4 mg total) by mouth every 8 (eight) hours as needed for nausea or vomiting. 05/19/23  Yes Gwyneth Sprout, MD  benzonatate (TESSALON) 100 MG capsule Take 1 capsule (100 mg total) by mouth every 8 (eight) hours. 01/06/23   Carlisle Beers, FNP  Guaifenesin 1200 MG TB12 Take 1 tablet (1,200 mg total) by mouth in the morning and at bedtime. 01/06/23   Carlisle Beers, FNP  ipratropium (ATROVENT) 0.06 % nasal spray Place 2 sprays into both nostrils 4 (four) times daily. 08/11/22   Shirlee Latch, PA-C  meclizine (ANTIVERT) 25 MG tablet Take 1 tablet (25 mg total) by mouth 3 (three) times daily as needed for dizziness. 03/25/21   Arthor Captain, PA-C  promethazine-dextromethorphan (PROMETHAZINE-DM) 6.25-15 MG/5ML syrup Take 5 mLs by mouth at bedtime as needed for cough. 01/06/23   Carlisle Beers, FNP      Allergies    Patient has no known allergies.    Review of Systems   Review of Systems  Respiratory:  Positive for cough.     Physical Exam Updated Vital Signs BP 133/84 (BP Location: Right Arm)   Pulse (!) 102   Temp (!) 100.7 F (38.2 C) (Oral)   Resp  20   Ht 6\' 1"  (1.854 m)   Wt 106.6 kg   SpO2 98%   BMI 31.00 kg/m  Physical Exam Vitals and nursing note reviewed.  Constitutional:      General: He is not in acute distress.    Appearance: He is well-developed.  HENT:     Head: Normocephalic and atraumatic.     Right Ear: Tympanic membrane normal.     Left Ear: Tympanic membrane normal.  Eyes:     Conjunctiva/sclera: Conjunctivae normal.     Pupils: Pupils are equal, round, and reactive to light.  Cardiovascular:     Rate and Rhythm: Regular rhythm. Tachycardia present.     Heart sounds: No murmur heard. Pulmonary:     Effort: Pulmonary effort is normal. No respiratory distress.     Breath sounds: Normal breath sounds. No wheezing or rales.  Abdominal:     General: There is no distension.     Palpations: Abdomen is soft.     Tenderness: There is no abdominal tenderness. There is no guarding or rebound.  Musculoskeletal:        General: No tenderness. Normal range of motion.     Cervical back: Normal range of motion and neck supple.     Right lower leg: No edema.     Left lower leg: No edema.  Skin:    General: Skin is  warm and dry.     Findings: No erythema or rash.  Neurological:     Mental Status: He is alert and oriented to person, place, and time.  Psychiatric:        Behavior: Behavior normal.     ED Results / Procedures / Treatments   Labs (all labs ordered are listed, but only abnormal results are displayed) Labs Reviewed  RESP PANEL BY RT-PCR (RSV, FLU A&B, COVID)  RVPGX2 - Abnormal; Notable for the following components:      Result Value   Influenza A by PCR POSITIVE (*)    All other components within normal limits    EKG None  Radiology DG Chest 2 View Result Date: 05/19/2023 CLINICAL DATA:  33 year old male with history of cough and fever. EXAM: CHEST - 2 VIEW COMPARISON:  Chest x-ray 03/25/2021. FINDINGS: Lung volumes are normal. No consolidative airspace disease. No pleural effusions. No  pneumothorax. No pulmonary nodule or mass noted. Pulmonary vasculature and the cardiomediastinal silhouette are within normal limits. IMPRESSION: No radiographic evidence of acute cardiopulmonary disease. Electronically Signed   By: Trudie Reed M.D.   On: 05/19/2023 07:50    Procedures Procedures    Medications Ordered in ED Medications  acetaminophen (TYLENOL) tablet 1,000 mg (has no administration in time range)  ondansetron (ZOFRAN-ODT) disintegrating tablet 4 mg (has no administration in time range)    ED Course/ Medical Decision Making/ A&P                                 Medical Decision Making Amount and/or Complexity of Data Reviewed Labs: ordered. Decision-making details documented in ED Course. Radiology: ordered and independent interpretation performed. Decision-making details documented in ED Course.  Risk OTC drugs. Prescription drug management.   Pt with symptoms consistent with influenza.  Normal exam here but is febrile.  No signs of breathing difficulty  No signs of strep pharyngitis, otitis or abnormal abdominal findings.   I have independently visualized and interpreted pt's images today. CXR wnl and viral panel positive for flu.  Will continue antipyretica and rest and fluids and return for any further problems.         Final Clinical Impression(s) / ED Diagnoses Final diagnoses:  Influenza A    Rx / DC Orders ED Discharge Orders          Ordered    ondansetron (ZOFRAN-ODT) 4 MG disintegrating tablet  Every 8 hours PRN        05/19/23 5284              Gwyneth Sprout, MD 05/19/23 [phone]

## 2023-05-22 ENCOUNTER — Emergency Department
Admission: EM | Admit: 2023-05-22 | Discharge: 2023-05-22 | Disposition: A | Discharge status: Home / Self Care | Payer: Medicaid Other | Attending: Emergency Medicine | Admitting: Emergency Medicine

## 2023-05-22 ENCOUNTER — Emergency Department: Payer: Medicaid Other

## 2023-05-22 ENCOUNTER — Other Ambulatory Visit: Payer: Self-pay

## 2023-05-22 DIAGNOSIS — J111 Influenza due to unidentified influenza virus with other respiratory manifestations: Secondary | ICD-10-CM | POA: Insufficient documentation

## 2023-05-22 DIAGNOSIS — R519 Headache, unspecified: Secondary | ICD-10-CM | POA: Diagnosis present

## 2023-05-22 MED ORDER — KETOROLAC TROMETHAMINE 30 MG/ML IJ SOLN
30.0000 mg | Freq: Once | INTRAMUSCULAR | Status: AC
  Administered 2023-05-22: 30 mg via INTRAMUSCULAR
  Filled 2023-05-22: qty 1

## 2023-05-22 NOTE — ED Provider Notes (Signed)
   [facility] Provider Note    Event Date/Time   First MD Initiated Contact with Patient 05/22/23 [phone]     (approximate)   History   Influenza   HPI  Robert Vazquez is a 33 y.o. male who was diagnosed with influenza 3 days ago who reports that he continues to feel ill.  He complains of headache, body aches, chills, cough.  He reports this is day 5 of illness.  No shortness of breath     Physical Exam   Triage Vital Signs: ED Triage Vitals  Encounter Vitals Group     BP 05/22/23 0731 112/84     Systolic BP Percentile --      Diastolic BP Percentile --      Pulse Rate 05/22/23 0731 90     Resp 05/22/23 0730 19     Temp 05/22/23 0730 98.8 F (37.1 C)     Temp src --      SpO2 05/22/23 0731 96 %     Weight 05/22/23 0731 104.3 kg (230 lb)     Height 05/22/23 0731 1.854 m (6\' 1" )     Head Circumference --      Peak Flow --      Pain Score 05/22/23 0731 6     Pain Loc --      Pain Education --      Exclude from Growth Chart --     Most recent vital signs: Vitals:   05/22/23 0730 05/22/23 0731  BP:  112/84  Pulse:  90  Resp: 19   Temp: 98.8 F (37.1 C)   SpO2:  96%     General: Awake, no distress.  CV:  Good peripheral perfusion.  Resp:  Normal effort.  Clear to auscultation bilaterally Abd:  No distention.  Other:     ED Results / Procedures / Treatments   Labs (all labs ordered are listed, but only abnormal results are displayed) Labs Reviewed - No data to display   EKG     RADIOLOGY 10 days    PROCEDURES:  Critical Care performed:   Procedures   MEDICATIONS ORDERED IN ED: Medications  ketorolac (TORADOL) 30 MG/ML injection 30 mg (30 mg Intramuscular Given 05/22/23 0804)     IMPRESSION / MDM / ASSESSMENT AND PLAN / ED COURSE  I reviewed the triage vital signs and the nursing notes. Patient's presentation is most consistent with acute illness / injury with system symptoms.  Patient presents with continued  influenza symptoms, he is on day 5 of illness, he complains of more cough and some chest discomfort, question possibility of pneumonia on top of influenza will send for chest x-ray, treat with IM Toradol  X-ray is reassuring, recommend continued supportive care counseled patient that influenza symptoms can last up to 10 days  Chest x-ray is reassuring, appropriate for discharge outpatient follow-up      FINAL CLINICAL IMPRESSION(S) / ED DIAGNOSES   Final diagnoses:  Influenza     Rx / DC Orders   ED Discharge Orders     None        Note:  This document was prepared using Dragon voice recognition software and may include unintentional dictation errors.   Jene Every, MD 05/22/23 0830

## 2023-05-22 NOTE — ED Triage Notes (Signed)
Pt to ED for continuing to feel bad after dx with flu on 2/12. Spouse here for same.

## 2023-07-08 ENCOUNTER — Encounter (HOSPITAL_COMMUNITY): Payer: Self-pay | Admitting: Family Medicine

## 2023-07-08 ENCOUNTER — Ambulatory Visit (HOSPITAL_COMMUNITY)
Admission: EM | Admit: 2023-07-08 | Discharge: 2023-07-08 | Disposition: A | Discharge status: Home / Self Care | Attending: Family Medicine | Admitting: Family Medicine

## 2023-07-08 DIAGNOSIS — J302 Other seasonal allergic rhinitis: Secondary | ICD-10-CM

## 2023-07-08 DIAGNOSIS — R0789 Other chest pain: Secondary | ICD-10-CM | POA: Diagnosis not present

## 2023-07-08 DIAGNOSIS — E860 Dehydration: Secondary | ICD-10-CM

## 2023-07-08 MED ORDER — FLUTICASONE PROPIONATE 50 MCG/ACT NA SUSP: 1.0000 | Freq: Every day | NASAL | 0 refills | Status: AC

## 2023-07-08 NOTE — ED Provider Notes (Signed)
 [facility]    CSN: [id number] Arrival date & time: 07/08/23  4166      History   Chief Complaint Chief Complaint  Patient presents with   Cough   Generalized Body Aches    HPI Robert Vazquez is a 33 y.o. male.   The patient reports several weeks of intermittent, cramp like muscle aches in random places throughout his body including the chest muscles. 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 He sometimes has sharp mid sternal chest pain for a few seconds at rest but nothing with exertion. He is normally able to press on the areas that hurt and reproduce the pain.  The pt has also had increased allergies recently with post nasal drip that causes him a sore throat. He has not tried any medication to help with this. He denies any fever, chills, cough, shortness of breath, wheezing or swelling. He intermittently has lightheadedness when standing up which he believes is due to dehydration. His symptoms overall are not similar to those he had with the flu several weeks ago.   The history is provided by the patient.  Cough Associated symptoms: headaches (intermittently), myalgias and sore throat (in the morning only)   Associated symptoms: no chest pain, no chills, no fever, no rash, no rhinorrhea, no shortness of breath and no wheezing     Past Medical History:  Diagnosis Date   Hypertension     Patient Active Problem List   Diagnosis Date Noted   Heart burn 05/06/2022    History reviewed. No pertinent surgical history.     Home Medications    Prior to Admission medications   Medication Sig Start Date End Date Taking? Authorizing Provider  fluticasone (FLONASE) 50 MCG/ACT nasal spray Place 1 spray into both nostrils daily. 07/08/23  Yes Ivor Messier, MD  benzonatate (TESSALON) 100 MG capsule Take 1 capsule (100 mg total) by mouth every 8 (eight) hours. 01/06/23   Carlisle Beers, FNP  Guaifenesin 1200  MG TB12 Take 1 tablet (1,200 mg total) by mouth in the morning and at bedtime. 01/06/23   Carlisle Beers, FNP  ipratropium (ATROVENT) 0.06 % nasal spray Place 2 sprays into both nostrils 4 (four) times daily. 08/11/22   Shirlee Latch, PA-C  meclizine (ANTIVERT) 25 MG tablet Take 1 tablet (25 mg total) by mouth 3 (three) times daily as needed for dizziness. 03/25/21   Arthor Captain, PA-C  ondansetron (ZOFRAN-ODT) 4 MG disintegrating tablet Take 1 tablet (4 mg total) by mouth every 8 (eight) hours as needed for nausea or vomiting. 05/19/23   Gwyneth Sprout, MD  promethazine-dextromethorphan (PROMETHAZINE-DM) 6.25-15 MG/5ML syrup Take 5 mLs by mouth at bedtime as needed for cough. 01/06/23   Carlisle Beers, FNP    Family History Family History  Problem Relation Age of Onset   CAD Father     Social History Social History   Tobacco Use   Smoking status: Former    Types: Cigarettes   Smokeless tobacco: Never  Vaping Use   Vaping status: Never Used  Substance Use Topics   Alcohol use: Yes    Comment: socially   Drug use: No     Allergies   Patient has no known allergies.   Review of Systems Review of Systems  Constitutional:  Negative for chills, fatigue and fever.  HENT:  Positive for postnasal drip, sneezing and sore throat (in the morning  only). Negative for congestion, rhinorrhea, sinus pressure, sinus pain, tinnitus and trouble swallowing.   Eyes:  Negative for redness, itching and visual disturbance.  Respiratory:  Positive for cough. Negative for chest tightness, shortness of breath and wheezing.   Cardiovascular:  Negative for chest pain, palpitations and leg swelling.  Musculoskeletal:  Positive for myalgias. Negative for arthralgias, back pain, gait problem, joint swelling and neck pain.  Skin:  Negative for rash.  Allergic/Immunologic: Positive for environmental allergies.  Neurological:  Positive for headaches (intermittently). Negative for dizziness.      Physical Exam Triage Vital Signs ED Triage Vitals  Encounter Vitals Group     BP 07/08/23 0921 (!) 124/91     Systolic BP Percentile --      Diastolic BP Percentile --      Pulse Rate 07/08/23 0921 78     Resp 07/08/23 0921 16     Temp 07/08/23 0921 98.1 F (36.7 C)     Temp Source 07/08/23 0921 Oral     SpO2 07/08/23 0921 100 %     Weight --      Height --      Head Circumference --      Peak Flow --      Pain Score 07/08/23 0924 3     Pain Loc --      Pain Education --      Exclude from Growth Chart --    No data found.  Updated Vital Signs BP (!) 124/91 (BP Location: Right Arm)   Pulse 78   Temp 98.1 F (36.7 C) (Oral)   Resp 16   SpO2 100%   Visual Acuity Right Eye Distance:   Left Eye Distance:   Bilateral Distance:    Right Eye Near:   Left Eye Near:    Bilateral Near:     Physical Exam Vitals reviewed.  Constitutional:      General: He is not in acute distress.    Appearance: Normal appearance. He is not ill-appearing, toxic-appearing or diaphoretic.  HENT:     Head: Normocephalic and atraumatic.     Nose:     Comments: Erythematous turbinates     Mouth/Throat:     Mouth: Mucous membranes are moist.     Pharynx: No oropharyngeal exudate or posterior oropharyngeal erythema.     Comments: Posterior oropharyngeal clear drainage  Eyes:     General: No scleral icterus.       Right eye: No discharge.        Left eye: No discharge.     Extraocular Movements: Extraocular movements intact.     Conjunctiva/sclera: Conjunctivae normal.     Pupils: Pupils are equal, round, and reactive to light.  Cardiovascular:     Rate and Rhythm: Normal rate.     Pulses: Normal pulses.     Heart sounds: No murmur heard.    No friction rub. No gallop.  Pulmonary:     Effort: Pulmonary effort is normal. No respiratory distress.     Breath sounds: Normal breath sounds. No wheezing.  Musculoskeletal:     Comments: Mild TTP over the pectoralis muscles Pain  reproduced with contraction of the chest muscles Abduction and external rotation of the shoulder passively causes some chest discomfort No pain with chest compression  Skin:    Capillary Refill: Capillary refill takes more than 3 seconds.  Neurological:     General: No focal deficit present.     Mental Status: He is alert and  oriented to person, place, and time.     Sensory: No sensory deficit.  Psychiatric:        Mood and Affect: Mood normal.        Thought Content: Thought content normal.        Judgment: Judgment normal.      UC Treatments / Results  Labs (all labs ordered are listed, but only abnormal results are displayed) Labs Reviewed - No data to display  EKG   Radiology No results found.  Procedures Procedures (including critical care time)  Medications Ordered in UC Medications - No data to display  Initial Impression / Assessment and Plan / UC Course  I have reviewed the triage vital signs and the nursing notes.  Pertinent labs & imaging results that were available during my care of the patient were reviewed by me and considered in my medical decision making (see chart for details).     Seasonal Allergies - I discussed the treatment options including flonase for post nasal drip.  - If this is not effective he can try oral antihistamines but I cautioned on the need to hydrate given his chronic dehydration  Muscular chest pain - Although the patient's pain is reproducible on exam he reports intermittent central chest pain at rest for several seconds. Because of this and a hx of his father having cardiac related death in his 76s, an EKG was ordered.  - Normal EKG findings - I believe the patient's muscle pains are related to his chronic dehydration.  - We discussed the proper amount of hydration an defects of chronic dehydration.  - The patient was also counseled on the importance of stretching - I recommended establishing with a PCP for further evaluation  and rule out any AVS risks prior to starting an exercise regimen.  - The patient voiced understanding and agreement with the plan.   Final Clinical Impressions(s) / UC Diagnoses   Final diagnoses:  Muscular chest pain  Seasonal allergies  Dehydration     Discharge Instructions      Start Flonase daily for the next 1-2 weeks Make sure to increase your oral hydration as chronic dehydration can cause muscle pain and complications with allergy medications I recommend setting up with a primary care provider for further evaluation of any risk factors given your family history If you develop any sharp stabbing central chest pain that does not go away go to the emergency room immediately     ED Prescriptions     Medication Sig Dispense Auth. Provider   fluticasone (FLONASE) 50 MCG/ACT nasal spray Place 1 spray into both nostrils daily. 16 g Ivor Messier, MD      PDMP not reviewed this encounter.   Ivor Messier, MD 07/08/23 [phone]

## 2023-07-08 NOTE — ED Triage Notes (Addendum)
 Chief Complaint: cough, sore throat, chest pain, and body aches. States had the flu a month ago. Had a negative home Covid test.  Chest pain: Patient describes pain as a dull ache rated 2/10 pain. Denies any sensation of heart racing, skipping beats, or palpitation. Denies any SOB, dizziness, or radiation of the pain. States having all over generalized body pain.   Sick exposure: No  Onset: 2 days ago   Prescriptions or OTC medications tried: No    New foods, medications, or products: No  Recent Travel: No

## 2023-07-08 NOTE — Discharge Instructions (Addendum)
 Start Flonase daily for the next 1-2 weeks Make sure to increase your oral hydration as chronic dehydration can cause muscle pain and complications with allergy medications I recommend setting up with a primary care provider for further evaluation of any risk factors given your family history If you develop any sharp stabbing central chest pain that does not go away go to the emergency room immediately

## 2023-07-30 ENCOUNTER — Ambulatory Visit (INDEPENDENT_AMBULATORY_CARE_PROVIDER_SITE_OTHER): Admitting: General Practice

## 2023-07-30 ENCOUNTER — Encounter: Payer: Self-pay | Admitting: General Practice

## 2023-07-30 VITALS — BP 120/76 | HR 77 | Temp 98.2°F | Ht 72.25 in | Wt 244.0 lb

## 2023-07-30 DIAGNOSIS — Z7689 Persons encountering health services in other specified circumstances: Secondary | ICD-10-CM | POA: Insufficient documentation

## 2023-07-30 DIAGNOSIS — I10 Essential (primary) hypertension: Secondary | ICD-10-CM | POA: Diagnosis not present

## 2023-07-30 DIAGNOSIS — K219 Gastro-esophageal reflux disease without esophagitis: Secondary | ICD-10-CM | POA: Diagnosis not present

## 2023-07-30 MED ORDER — PANTOPRAZOLE SODIUM 20 MG PO TBEC
20.0000 mg | DELAYED_RELEASE_TABLET | Freq: Every day | ORAL | 0 refills | Status: DC
Start: 2023-07-30 — End: 2023-08-31

## 2023-07-30 NOTE — Progress Notes (Signed)
 New Patient Office Visit  Subjective    Patient ID: Robert Vazquez, male    DOB: 1990-06-17  Age: 33 y.o. MRN: [id number]  CC:  Chief Complaint  Patient presents with   New Patient (Initial Visit)   Gastroesophageal Reflux    Needs rx sent; has been taking otc prilosec but not strong enough    HPI Robert Vazquez is a 33 y.o. male presents to establish care  Last PCP/physical/labs: no one for the last years. In prison prior to that.   Heartburn: diagnosed many years ago. Currently managed on Omeprazole 20 mg once daily. He has been taking it over the counter. He does take 40 mg on some days. He knows his trigger foods but he does eat them at times which then triggers his symptoms. Symptoms include esophageal burning, burning with vomiting.   Hypertension: history of hypertension. Was being treated while in prison but he has been off of medication for the past three years. He does not recall the name of the medication. He denies any headaches, blurred vision, chest pain or shortness of breath.   Outpatient Encounter Medications as of 07/30/2023  Medication Sig   fluticasone  (FLONASE ) 50 MCG/ACT nasal spray Place 1 spray into both nostrils daily.   pantoprazole (PROTONIX) 20 MG tablet Take 1 tablet (20 mg total) by mouth daily.   [DISCONTINUED] omeprazole (PRILOSEC) 20 MG capsule Take 20 mg by mouth daily.   [DISCONTINUED] benzonatate  (TESSALON ) 100 MG capsule Take 1 capsule (100 mg total) by mouth every 8 (eight) hours.   [DISCONTINUED] Guaifenesin  1200 MG TB12 Take 1 tablet (1,200 mg total) by mouth in the morning and at bedtime.   [DISCONTINUED] ipratropium (ATROVENT ) 0.06 % nasal spray Place 2 sprays into both nostrils 4 (four) times daily.   [DISCONTINUED] meclizine  (ANTIVERT ) 25 MG tablet Take 1 tablet (25 mg total) by mouth 3 (three) times daily as needed for dizziness.   [DISCONTINUED] ondansetron  (ZOFRAN -ODT) 4 MG disintegrating tablet Take 1 tablet (4 mg total) by mouth  every 8 (eight) hours as needed for nausea or vomiting.   [DISCONTINUED] promethazine -dextromethorphan (PROMETHAZINE -DM) 6.25-15 MG/5ML syrup Take 5 mLs by mouth at bedtime as needed for cough.   No facility-administered encounter medications on file as of 07/30/2023.    Past Medical History:  Diagnosis Date   GERD (gastroesophageal reflux disease) 2014   Hypertension     History reviewed. No pertinent surgical history.  Family History  Problem Relation Age of Onset   CAD Father    Heart disease Father     Social History   Socioeconomic History   Marital status: Married    Spouse name: Arlys Lamer   Number of children: 5   Years of education: Not on file   Highest education level: Not on file  Occupational History   Not on file  Tobacco Use   Smoking status: Former    Types: Cigarettes   Smokeless tobacco: Never  Vaping Use   Vaping status: Every Day   Substances: Nicotine  Substance and Sexual Activity   Alcohol use: Yes    Comment: socially   Drug use: No   Sexual activity: Yes    Birth control/protection: None  Other Topics Concern   Not on file  Social History Narrative   Not on file   Social Drivers of Health   Financial Resource Strain: Not on file  Food Insecurity: Not on file  Transportation Needs: Not on file  Physical Activity: Not on  file  Stress: Not on file  Social Connections: Not on file  Intimate Partner Violence: Not on file    Review of Systems  Constitutional:  Negative for chills and fever.  Respiratory:  Negative for shortness of breath.   Cardiovascular:  Negative for chest pain.  Gastrointestinal:  Positive for heartburn. Negative for abdominal pain, constipation, diarrhea, nausea and vomiting.  Genitourinary:  Negative for dysuria, frequency and urgency.  Neurological:  Negative for dizziness and headaches.  Endo/Heme/Allergies:  Negative for polydipsia.  Psychiatric/Behavioral:  Negative for depression and suicidal ideas. The  patient is not nervous/anxious.         Objective    BP 120/76 (BP Location: Left Arm, Patient Position: Sitting, Cuff Size: Normal)   Pulse 77   Temp 98.2 F (36.8 C) (Oral)   Ht 6' 0.25" (1.835 m)   Wt 244 lb (110.7 kg)   SpO2 97%   BMI 32.86 kg/m   Physical Exam Vitals and nursing note reviewed.  Constitutional:      Appearance: Normal appearance.  Cardiovascular:     Rate and Rhythm: Normal rate and regular rhythm.     Pulses: Normal pulses.     Heart sounds: Normal heart sounds.  Pulmonary:     Effort: Pulmonary effort is normal.     Breath sounds: Normal breath sounds.  Neurological:     Mental Status: He is alert and oriented to person, place, and time.  Psychiatric:        Mood and Affect: Mood normal.        Behavior: Behavior normal.        Thought Content: Thought content normal.        Judgment: Judgment normal.         Assessment & Plan:  Gastroesophageal reflux disease, unspecified whether esophagitis present Assessment & Plan: Uncontrolled.   Discontinue omeprazole 20 mg.   Start Pantoprazole 20 mg once daily before breakfast.  Rx sent. Handout given with diet.   Follow up in four weeks.    Orders: -     Pantoprazole Sodium; Take 1 tablet (20 mg total) by mouth daily.  Dispense: 30 tablet; Refill: 0  Establishing care with new doctor, encounter for Assessment & Plan: EMR reviewed briefly.    Hypertension, unspecified type Assessment & Plan: At goal today.   He will keep a log of BP at home and will bring back at the next visit.   Handout provided.  DASH diet.   Follow up in four weeks.    Return in about 4 weeks (around 08/27/2023) for GERD and BP.   Jolanda Nation, NP

## 2023-07-30 NOTE — Assessment & Plan Note (Signed)
 Uncontrolled.   Discontinue omeprazole 20 mg.   Start Pantoprazole 20 mg once daily before breakfast.  Rx sent. Handout given with diet.   Follow up in four weeks.

## 2023-07-30 NOTE — Patient Instructions (Addendum)
 Start monitoring your blood pressure daily, around the same time of day, for the next 2-3 weeks.  Ensure that you have rested for 30 minutes prior to checking your blood pressure.   Record your readings and notify me if you see numbers consistently at or above 130 on top and/or 90 on bottom.  Start Pantoprazole 20 mg once daily before breakfast once daily.   Follow up in 4 weeks.  It was a pleasure to meet you today! Please don't hesitate to contact me with any questions. Welcome to [facility]!

## 2023-07-30 NOTE — Assessment & Plan Note (Signed)
 EMR reviewed briefly.

## 2023-07-30 NOTE — Assessment & Plan Note (Signed)
 At goal today.   He will keep a log of BP at home and will bring back at the next visit.   Handout provided.  DASH diet.   Follow up in four weeks.

## 2023-08-27 ENCOUNTER — Ambulatory Visit: Admitting: General Practice

## 2023-08-27 DIAGNOSIS — K219 Gastro-esophageal reflux disease without esophagitis: Secondary | ICD-10-CM

## 2023-08-27 DIAGNOSIS — I10 Essential (primary) hypertension: Secondary | ICD-10-CM

## 2023-08-30 ENCOUNTER — Other Ambulatory Visit: Payer: Self-pay | Admitting: General Practice

## 2023-08-30 DIAGNOSIS — K219 Gastro-esophageal reflux disease without esophagitis: Secondary | ICD-10-CM

## 2023-08-31 NOTE — Telephone Encounter (Signed)
 Patient no showed for Follow up appt last week and did not reschedule. Please call patient to reschedule appt before refill will be sent.

## 2023-08-31 NOTE — Telephone Encounter (Signed)
 Called  pt and schedule a appt

## 2023-09-06 ENCOUNTER — Encounter: Payer: Self-pay | Admitting: General Practice

## 2023-09-06 ENCOUNTER — Ambulatory Visit (INDEPENDENT_AMBULATORY_CARE_PROVIDER_SITE_OTHER): Admitting: General Practice

## 2023-09-06 VITALS — BP 122/82 | HR 69 | Temp 98.0°F | Ht 72.25 in | Wt 246.0 lb

## 2023-09-06 DIAGNOSIS — Z7289 Other problems related to lifestyle: Secondary | ICD-10-CM | POA: Insufficient documentation

## 2023-09-06 DIAGNOSIS — K219 Gastro-esophageal reflux disease without esophagitis: Secondary | ICD-10-CM | POA: Diagnosis not present

## 2023-09-06 DIAGNOSIS — I10 Essential (primary) hypertension: Secondary | ICD-10-CM | POA: Diagnosis not present

## 2023-09-06 MED ORDER — PANTOPRAZOLE SODIUM 20 MG PO TBEC
20.0000 mg | DELAYED_RELEASE_TABLET | Freq: Every day | ORAL | 1 refills | Status: AC
Start: 2023-09-06 — End: ?

## 2023-09-06 NOTE — Progress Notes (Signed)
 Established Patient Office Visit  Subjective   Patient ID: Robert Vazquez, male    DOB: 04-30-1990  Age: 33 y.o. MRN: [id number]  Chief Complaint  Patient presents with   Hypertension    Patient here today to f/u on BP and doing well.    Gastroesophageal Reflux    Patient doing well on protonix  but think he may need to increase dose.     Hypertension Pertinent negatives include no chest pain, headaches or shortness of breath.  Gastroesophageal Reflux He complains of heartburn. He reports no abdominal pain, no chest pain or no nausea.    Robert Vazquez is a 33 year old male with past medical history of HTN, GERD presents today for a follow up on both.   HTN: He was evaluated on 07/30/23 and was asked to keep a BP log at home. The home BP readings have been in the 120's-130s / 70s-80's range. He denies any blurred vision, headaches, unilateral weakness, chest pain or shortness of breath. Has been vaping but hopes to quit soon. He has also been monitoring his diet and has been exercising.   GERD: currently managed on Pantoprazole  20 mg once daily. Overall doing well but still has symptoms. He has been trying to avoid his trigger foods but does eat them on some days. He has symptoms only on the days he has the trigger foods. He denies any chest pain, shortness of breath or difficulty breathing.    Patient Active Problem List   Diagnosis Date Noted   Current every day vaping 09/06/2023   Gastroesophageal reflux disease 07/30/2023   Hypertension 07/30/2023   Past Medical History:  Diagnosis Date   GERD (gastroesophageal reflux disease) 2014   Hypertension    History reviewed. No pertinent surgical history. No Known Allergies       09/06/2023   10:03 AM 07/30/2023   12:22 PM  Depression screen PHQ 2/9  Decreased Interest 0 3  Down, Depressed, Hopeless 0 1  PHQ - 2 Score 0 4  Altered sleeping 0 0  Tired, decreased energy 0 0  Change in appetite 0 0  Feeling bad or failure  about yourself  0 0  Trouble concentrating 0 1  Moving slowly or fidgety/restless 0 0  Suicidal thoughts 0 0  PHQ-9 Score 0 5  Difficult doing work/chores Not difficult at all Not difficult at all       09/06/2023   10:03 AM 07/30/2023   12:23 PM  GAD 7 : Generalized Anxiety Score  Nervous, Anxious, on Edge 0 0  Control/stop worrying 0 0  Worry too much - different things 0 1  Trouble relaxing 0 0  Restless 0 0  Easily annoyed or irritable 0 0  Afraid - awful might happen 0 0  Total GAD 7 Score 0 1  Anxiety Difficulty Not difficult at all Not difficult at all      Review of Systems  Constitutional:  Negative for chills and fever.  Respiratory:  Negative for shortness of breath.   Cardiovascular:  Negative for chest pain.  Gastrointestinal:  Positive for heartburn. Negative for abdominal pain, constipation, diarrhea, nausea and vomiting.  Genitourinary:  Negative for dysuria, frequency and urgency.  Neurological:  Negative for dizziness and headaches.  Endo/Heme/Allergies:  Negative for polydipsia.  Psychiatric/Behavioral:  Negative for depression and suicidal ideas. The patient is not nervous/anxious.       Objective:     BP 122/82 (BP Location: Left Arm, Patient Position: Sitting,  Cuff Size: Normal)   Pulse 69   Temp 98 F (36.7 C) (Oral)   Ht 6' 0.25" (1.835 m)   Wt 246 lb (111.6 kg)   SpO2 98%   BMI 33.13 kg/m  BP Readings from Last 3 Encounters:  09/06/23 122/82  07/30/23 120/76  07/08/23 (!) 124/91   Wt Readings from Last 3 Encounters:  09/06/23 246 lb (111.6 kg)  07/30/23 244 lb (110.7 kg)  05/22/23 230 lb (104.3 kg)      Physical Exam Vitals and nursing note reviewed.  Constitutional:      Appearance: Normal appearance.  Cardiovascular:     Rate and Rhythm: Normal rate and regular rhythm.     Pulses: Normal pulses.     Heart sounds: Normal heart sounds.  Pulmonary:     Effort: Pulmonary effort is normal.     Breath sounds: Normal breath  sounds.  Neurological:     Mental Status: He is alert and oriented to person, place, and time.  Psychiatric:        Mood and Affect: Mood normal.        Behavior: Behavior normal.        Thought Content: Thought content normal.        Judgment: Judgment normal.      No results found for any visits on 09/06/23.     The ASCVD Risk score (Arnett DK, et al., 2019) failed to calculate for the following reasons:   The 2019 ASCVD risk score is only valid for ages 70 to 74    Assessment & Plan:  Gastroesophageal reflux disease, unspecified whether esophagitis present Assessment & Plan: Controlled.  Handout provided regarding trigger foods.  Continue Pantoprazole  20 mg once daily. Refill sent.  Discussed at length to avoid trigger foods.   F/u in 6 months.  Orders: -     Pantoprazole  Sodium; Take 1 tablet (20 mg total) by mouth daily.  Dispense: 90 tablet; Refill: 1  Hypertension, unspecified type Assessment & Plan: At goal today.  Home readings also seem to be at goal.  He has cut back on salt and started exercise.   Follow up in 6 months.   Current every day vaping Assessment & Plan: Discussed cessation.  He plans to quit after he runs out.     Return in about 6 months (around 03/07/2024) for physical.    Jolanda Nation, NP

## 2023-09-06 NOTE — Assessment & Plan Note (Addendum)
 Discussed cessation.  He plans to quit after he runs out.

## 2023-09-06 NOTE — Assessment & Plan Note (Addendum)
 Controlled.  Handout provided regarding trigger foods.  Continue Pantoprazole  20 mg once daily. Refill sent.  Discussed at length to avoid trigger foods.   F/u in 6 months.

## 2023-09-06 NOTE — Assessment & Plan Note (Signed)
 At goal today.  Home readings also seem to be at goal.  He has cut back on salt and started exercise.   Follow up in 6 months.

## 2023-09-06 NOTE — Patient Instructions (Addendum)
 Notify me if you see numbers consistently at or above 130 on top and/or 90 on bottom. Continue to monitor dash, reduce sodium intake and exercise.   STOP VAPING.   Schedule TDAP at the health department or pharmacy.   Continue pantoprazole  20 mg once daily. Refill sent.  Follow up in 6 months for physical.  It was a pleasure to see you today!

## 2023-09-14 ENCOUNTER — Emergency Department
Admission: EM | Admit: 2023-09-14 | Discharge: 2023-09-14 | Disposition: A | Discharge status: Home / Self Care | Attending: Emergency Medicine | Admitting: Emergency Medicine

## 2023-09-14 ENCOUNTER — Emergency Department

## 2023-09-14 ENCOUNTER — Encounter: Payer: Self-pay | Admitting: Emergency Medicine

## 2023-09-14 ENCOUNTER — Other Ambulatory Visit: Payer: Self-pay

## 2023-09-14 DIAGNOSIS — I10 Essential (primary) hypertension: Secondary | ICD-10-CM | POA: Insufficient documentation

## 2023-09-14 DIAGNOSIS — R079 Chest pain, unspecified: Secondary | ICD-10-CM | POA: Diagnosis not present

## 2023-09-14 DIAGNOSIS — R5383 Other fatigue: Secondary | ICD-10-CM | POA: Insufficient documentation

## 2023-09-14 LAB — CBC
HCT: 46.8 % (ref 39.0–52.0)
Hemoglobin: 15.7 g/dL (ref 13.0–17.0)
MCH: 31.5 pg (ref 26.0–34.0)
MCHC: 33.5 g/dL (ref 30.0–36.0)
MCV: 94 fL (ref 80.0–100.0)
Platelets: 308 10*3/uL (ref 150–400)
RBC: 4.98 MIL/uL (ref 4.22–5.81)
RDW: 12.5 % (ref 11.5–15.5)
WBC: 4.1 10*3/uL (ref 4.0–10.5)
nRBC: 0 % (ref 0.0–0.2)

## 2023-09-14 LAB — BASIC METABOLIC PANEL WITH GFR
Anion gap: 10 (ref 5–15)
BUN: 8 mg/dL (ref 6–20)
CO2: 25 mmol/L (ref 22–32)
Calcium: 9 mg/dL (ref 8.9–10.3)
Chloride: 104 mmol/L (ref 98–111)
Creatinine, Ser: 1.06 mg/dL (ref 0.61–1.24)
GFR, Estimated: >60 mL/min (ref >60)
Glucose, Bld: 108 mg/dL — ABNORMAL HIGH (ref 70–99)
Potassium: 3.4 mmol/L — ABNORMAL LOW (ref 3.5–5.1)
Sodium: 139 mmol/L (ref 135–145)

## 2023-09-14 LAB — TROPONIN I (HIGH SENSITIVITY): Troponin I (High Sensitivity): <2 ng/L (ref <18)

## 2023-09-14 MED ORDER — SUCRALFATE 1 G PO TABS: 1.0000 g | ORAL_TABLET | Freq: Four times a day (QID) | ORAL | 0 refills | Status: AC

## 2023-09-14 NOTE — ED Provider Notes (Signed)
 [facility] Provider Note    Event Date/Time   First MD Initiated Contact with Patient 09/14/23 0809     (approximate)  History   Chief Complaint: Chest Pain  HPI  Robert Vazquez is a 33 y.o. male with a past medical history of gastric reflux, hypertension, presents emergency department for central chest pain.  According to the patient over the past 6 months or so he has been experiencing on and off pain in the center of his chest.  Patient states significant heartburn/reflux.  States his doctor told him that this was likely the cause of the pain and started him recently on a daily GERD medication.  Patient states he has been taking this medication but continues to have intermittent chest pain he was concerned so he came to the emergency department.  Patient denies any shortness of breath nausea or diaphoresis.  Patient states he has been feeling fatigued especially in the morning recently.  Physical Exam   Triage Vital Signs: ED Triage Vitals  Encounter Vitals Group     BP 09/14/23 0801 124/78     Systolic BP Percentile --      Diastolic BP Percentile --      Pulse Rate 09/14/23 0801 84     Resp 09/14/23 0801 17     Temp 09/14/23 0801 98 F (36.7 C)     Temp Source 09/14/23 0801 Oral     SpO2 09/14/23 0801 100 %     Weight 09/14/23 0800 240 lb (108.9 kg)     Height 09/14/23 0800 6\' 1"  (1.854 m)     Head Circumference --      Peak Flow --      Pain Score 09/14/23 0800 4     Pain Loc --      Pain Education --      Exclude from Growth Chart --     Most recent vital signs: Vitals:   09/14/23 0801  BP: 124/78  Pulse: 84  Resp: 17  Temp: 98 F (36.7 C)  SpO2: 100%    General: Awake, no distress. CV:  Good peripheral perfusion.  Regular rate and rhythm  Resp:  Normal effort.  Equal breath sounds bilaterally.  Abd:  No distention.  Soft, nontender.  No rebound or guarding.  ED Results / Procedures / Treatments   EKG  EKG viewed and  interpreted by myself shows a normal sinus rhythm at 91 bpm with a narrow QRS, normal axis, normal intervals, no concerning ST changes.  RADIOLOGY  I have reviewed and interpreted chest x-ray images.  No consolidation on my evaluation. Radiology has read the x-ray as negative   MEDICATIONS ORDERED IN ED: Medications - No data to display   IMPRESSION / MDM / ASSESSMENT AND PLAN / ED COURSE  I reviewed the triage vital signs and the nursing notes.  Patient's presentation is most consistent with acute presentation with potential threat to life or bodily function.  Patient presents to the emergency department for central chest pain intermittent over the last 6 months.  Overall the patient appears well, no distress.  Reassuring EKG.  Reassuring chest x-ray.  Reassuring physical exam and vital signs.  Patient's description of the chest pain very likely could be reflux related.  States it mostly occurs in the morning but will occasionally occur throughout the day.  Patient states a history of significant reflux and only recently was started on a medication for this.  If the patient's blood  work does not show any significant abnormality I believe the patient could likely be discharged home with outpatient follow-up however in addition to the patient's daily GERD medication he just started I would also add sucralfate 4 times daily for 2 weeks.  Patient is agreeable to this plan.  Patient's workup is reassuring with a normal CBC chemistry and a negative troponin.  Given the patient's reassuring workup I believe the patient safe for discharge home.  Will have the patient follow-up with his doctor will start on sucralfate.  FINAL CLINICAL IMPRESSION(S) / ED DIAGNOSES   Chest pain    Note:  This document was prepared using Dragon voice recognition software and may include unintentional dictation errors.   Ruth Cove, MD 09/14/23 [phone]

## 2023-09-14 NOTE — ED Notes (Signed)
 Patient transported to X-ray

## 2023-09-14 NOTE — ED Triage Notes (Signed)
 Patient to ED via POV for centralized CP. Ongoing x2-3 weeks. States he feels tired. Pt reports PCP told him he has acid reflux.

## 2023-10-07 ENCOUNTER — Emergency Department

## 2023-10-07 ENCOUNTER — Other Ambulatory Visit: Payer: Self-pay

## 2023-10-07 ENCOUNTER — Emergency Department
Admission: EM | Admit: 2023-10-07 | Discharge: 2023-10-07 | Disposition: A | Discharge status: Home / Self Care | Attending: Emergency Medicine | Admitting: Emergency Medicine

## 2023-10-07 DIAGNOSIS — K219 Gastro-esophageal reflux disease without esophagitis: Secondary | ICD-10-CM | POA: Diagnosis not present

## 2023-10-07 DIAGNOSIS — R079 Chest pain, unspecified: Secondary | ICD-10-CM

## 2023-10-07 DIAGNOSIS — I10 Essential (primary) hypertension: Secondary | ICD-10-CM | POA: Insufficient documentation

## 2023-10-07 DIAGNOSIS — R0789 Other chest pain: Secondary | ICD-10-CM | POA: Diagnosis present

## 2023-10-07 LAB — CBC
HCT: 44.7 % (ref 39.0–52.0)
Hemoglobin: 15.1 g/dL (ref 13.0–17.0)
MCH: 32.3 pg (ref 26.0–34.0)
MCHC: 33.8 g/dL (ref 30.0–36.0)
MCV: 95.5 fL (ref 80.0–100.0)
Platelets: 286 10*3/uL (ref 150–400)
RBC: 4.68 MIL/uL (ref 4.22–5.81)
RDW: 12.9 % (ref 11.5–15.5)
WBC: 5.1 10*3/uL (ref 4.0–10.5)
nRBC: 0 % (ref 0.0–0.2)

## 2023-10-07 LAB — BASIC METABOLIC PANEL WITH GFR
Anion gap: 7 (ref 5–15)
BUN: 10 mg/dL (ref 6–20)
CO2: 28 mmol/L (ref 22–32)
Calcium: 9.1 mg/dL (ref 8.9–10.3)
Chloride: 106 mmol/L (ref 98–111)
Creatinine, Ser: 1.14 mg/dL (ref 0.61–1.24)
GFR, Estimated: >60 mL/min (ref >60)
Glucose, Bld: 102 mg/dL — ABNORMAL HIGH (ref 70–99)
Potassium: 3.8 mmol/L (ref 3.5–5.1)
Sodium: 141 mmol/L (ref 135–145)

## 2023-10-07 LAB — TROPONIN I (HIGH SENSITIVITY): Troponin I (High Sensitivity): <2 ng/L (ref <18)

## 2023-10-07 MED ORDER — ALUM & MAG HYDROXIDE-SIMETH 200-200-20 MG/5ML PO SUSP
30.0000 mL | Freq: Once | ORAL | Status: AC
  Administered 2023-10-07: 30 mL via ORAL
  Filled 2023-10-07: qty 30

## 2023-10-07 MED ORDER — CALCIUM CARBONATE ANTACID 500 MG PO CHEW: 2.0000 | CHEWABLE_TABLET | Freq: Three times a day (TID) | ORAL | 0 refills | Status: AC | PRN

## 2023-10-07 MED ORDER — FAMOTIDINE 20 MG PO TABS
40.0000 mg | ORAL_TABLET | Freq: Once | ORAL | Status: AC
  Administered 2023-10-07: 40 mg via ORAL
  Filled 2023-10-07: qty 2

## 2023-10-07 MED ORDER — FAMOTIDINE 20 MG PO TABS: 20.0000 mg | ORAL_TABLET | Freq: Two times a day (BID) | ORAL | 0 refills | Status: AC

## 2023-10-07 MED ORDER — METOCLOPRAMIDE HCL 10 MG PO TABS
10.0000 mg | ORAL_TABLET | Freq: Once | ORAL | Status: AC
  Administered 2023-10-07: 10 mg via ORAL
  Filled 2023-10-07: qty 1

## 2023-10-07 NOTE — ED Notes (Addendum)
 Pt is CAOx4, breathing normally, and normal in color. Pt complaining of chest pain that radiates bilaterally under his rib cage x2 weeks. Pt states he has been seen for same and keeps getting told that it's acid reflux, however pt states that the medications given to him for that has not helped. PT also concerned about a swollen lump under his right armpit that came up about 2 months ago. Pt in NAD at this time and requests only a blanket.

## 2023-10-07 NOTE — ED Provider Notes (Signed)
 [facility] Provider Note    Event Date/Time   First MD Initiated Contact with Patient 10/07/23 1314     (approximate)   History   Chief Complaint: Chest Pain   HPI  Robert Vazquez is a 33 y.o. male with past history of GERD and hypertension who comes ED complaining of epigastric pain radiating up into the chest which has been intermittent for the last 2 weeks.  Occurs about every other day.  Seems to be worse after eating.  No fever or chills, no shortness of breath.  Nonexertional nonpleuritic.  No vomiting  Was previously evaluated for similar symptoms, prescribed Protonix  by his PCP and then Carafate  by ED.  He reports he is not taking any of these medications.      Past Medical History:  Diagnosis Date   GERD (gastroesophageal reflux disease) 2014   Hypertension     Current Outpatient Rx   Order #: [id number] Class: Normal   Order #: [id number] Class: Normal   Order #: [id number] Class: Normal   Order #: [id number] Class: Normal   Order #: [id number] Class: Normal    History reviewed. No pertinent surgical history.  Physical Exam   Triage Vital Signs: ED Triage Vitals  Encounter Vitals Group     BP 10/07/23 1241 (!) 135/93     Girls Systolic BP Percentile --      Girls Diastolic BP Percentile --      Boys Systolic BP Percentile --      Boys Diastolic BP Percentile --      Pulse Rate 10/07/23 1241 94     Resp 10/07/23 1241 18     Temp 10/07/23 1241 98 F (36.7 C)     Temp src --      SpO2 10/07/23 1241 100 %     Weight 10/07/23 1240 240 lb (108.9 kg)     Height 10/07/23 1240 6' 1 (1.854 m)     Head Circumference --      Peak Flow --      Pain Score 10/07/23 1240 4     Pain Loc --      Pain Education --      Exclude from Growth Chart --     Most recent vital signs: Vitals:   10/07/23 1241  BP: (!) 135/93  Pulse: 94  Resp: 18  Temp: 98 F (36.7 C)  SpO2: 100%    General: Awake, no distress.  CV:  Good peripheral  perfusion.  Regular rate rhythm Resp:  Normal effort.  Clear to auscultation Abd:  No distention.  Soft nontender Other:  Moist oral mucosa   ED Results / Procedures / Treatments   Labs (all labs ordered are listed, but only abnormal results are displayed) Labs Reviewed  BASIC METABOLIC PANEL WITH GFR - Abnormal; Notable for the following components:      Result Value   Glucose, Bld 102 (*)    All other components within normal limits  CBC  TROPONIN I (HIGH SENSITIVITY)     EKG Interpreted by me Sinus rhythm rate of 74.  Normal axis intervals QRS and ST segments.  Isolated T wave inversion in lead III which is nonspecific.   RADIOLOGY Chest x-ray interpreted by me, unremarkable.  Radiology report reviewed   PROCEDURES:  Procedures   MEDICATIONS ORDERED IN ED: Medications  metoCLOPramide (REGLAN) tablet 10 mg (10 mg Oral Given 10/07/23 1346)  famotidine (PEPCID) tablet 40 mg (40 mg Oral Given 10/07/23 1345)  alum &  mag hydroxide-simeth (MAALOX/MYLANTA) 200-200-20 MG/5ML suspension 30 mL (30 mLs Oral Given 10/07/23 1345)     IMPRESSION / MDM / ASSESSMENT AND PLAN / ED COURSE  I reviewed the triage vital signs and the nursing notes.  DDx: AKI, electrolyte derangement, arrhythmia, non-STEMI, GERD, pneumothorax  Patient's presentation is most consistent with acute presentation with potential threat to life or bodily function.  Patient presents with nonspecific chest pain, atypical in nature, low likelihood of ACS PE dissection or other serious cause.  Vital signs and exam are reassuring, EKG chest x-ray and labs were all normal.  Feeling better after receiving antacids in the ED.  Presentation is compatible with GERD.  Encouraged the patient to start taking antacids daily and follow-up with primary care.       FINAL CLINICAL IMPRESSION(S) / ED DIAGNOSES   Final diagnoses:  Nonspecific chest pain  Gastroesophageal reflux disease without esophagitis     Rx / DC  Orders   ED Discharge Orders          Ordered    famotidine (PEPCID) 20 MG tablet  2 times daily        10/07/23 1423    calcium carbonate (TUMS) 500 MG chewable tablet  3 times daily PRN        10/07/23 1423             Note:  This document was prepared using Dragon voice recognition software and may include unintentional dictation errors.   Viviann Pastor, MD 10/07/23 1426

## 2023-10-07 NOTE — ED Triage Notes (Addendum)
 Pt comes with c/o mid sternal cp that radiates to both sides for two weeks now. Pt states no sob. Pt states he keeps having this and not sure what is wrong. Pt denies any N/V. Pt states on and off dizziness.   Pt states bump under right arm for two months.

## 2023-10-26 ENCOUNTER — Emergency Department

## 2023-10-26 ENCOUNTER — Encounter: Payer: Self-pay | Admitting: Emergency Medicine

## 2023-10-26 ENCOUNTER — Other Ambulatory Visit: Payer: Self-pay

## 2023-10-26 ENCOUNTER — Emergency Department
Admission: EM | Admit: 2023-10-26 | Discharge: 2023-10-26 | Disposition: A | Discharge status: Home / Self Care | Attending: Emergency Medicine | Admitting: Emergency Medicine

## 2023-10-26 DIAGNOSIS — R0789 Other chest pain: Secondary | ICD-10-CM | POA: Insufficient documentation

## 2023-10-26 DIAGNOSIS — H66003 Acute suppurative otitis media without spontaneous rupture of ear drum, bilateral: Secondary | ICD-10-CM | POA: Diagnosis not present

## 2023-10-26 DIAGNOSIS — I10 Essential (primary) hypertension: Secondary | ICD-10-CM | POA: Insufficient documentation

## 2023-10-26 LAB — CBC
HCT: 45.5 % (ref 39.0–52.0)
Hemoglobin: 15.2 g/dL (ref 13.0–17.0)
MCH: 31.7 pg (ref 26.0–34.0)
MCHC: 33.4 g/dL (ref 30.0–36.0)
MCV: 95 fL (ref 80.0–100.0)
Platelets: 282 K/uL (ref 150–400)
RBC: 4.79 MIL/uL (ref 4.22–5.81)
RDW: 12.5 % (ref 11.5–15.5)
WBC: 4.6 K/uL (ref 4.0–10.5)
nRBC: 0 % (ref 0.0–0.2)

## 2023-10-26 LAB — BASIC METABOLIC PANEL WITH GFR
Anion gap: 10 (ref 5–15)
BUN: 9 mg/dL (ref 6–20)
CO2: 26 mmol/L (ref 22–32)
Calcium: 9.3 mg/dL (ref 8.9–10.3)
Chloride: 103 mmol/L (ref 98–111)
Creatinine, Ser: 1.18 mg/dL (ref 0.61–1.24)
GFR, Estimated: >60 mL/min (ref >60)
Glucose, Bld: 105 mg/dL — ABNORMAL HIGH (ref 70–99)
Potassium: 3.7 mmol/L (ref 3.5–5.1)
Sodium: 139 mmol/L (ref 135–145)

## 2023-10-26 LAB — TROPONIN I (HIGH SENSITIVITY): Troponin I (High Sensitivity): <2 ng/L (ref <18)

## 2023-10-26 MED ORDER — AZITHROMYCIN 250 MG PO TABS
ORAL_TABLET | ORAL | 0 refills | Status: AC
Start: 2023-10-26 — End: ?

## 2023-10-26 NOTE — ED Triage Notes (Signed)
 Patient to ED via POV for centralized CP that radiates into left chest. Intermittent x1 month. Also having bilateral ear pain.

## 2023-10-26 NOTE — Discharge Instructions (Addendum)
 Your exam, labs, EKG, and chest x-ray are all normal and reassuring at this time.  No signs of a serious cause of your chest pain, specifically no cardiac or pulmonary reason has been indicated.  Your symptoms likely represent discomfort related to your severe GERD.  Take the prescription meds as directed.  Follow-up with your primary provider or GI medicine as suggested.

## 2023-10-26 NOTE — ED Provider Notes (Signed)
 [facility] Emergency Department Provider Note     Event Date/Time   First MD Initiated Contact with Patient 10/26/23 1109     (approximate)   History   Chest Pain   HPI  Robert Vazquez is a 33 y.o. male with a history of GERD, hypertension, and currently smoking, presents endorsing centralized chest pain.  He reports referral to left-sided distress.  Patient endorsed intermittent symptoms for the last several months.  Chart review reveals patient been evaluated in the ED for similar complaint over the last 6 to 12 months.  Previous cardiac workups have been unremarkable.  Physical Exam   Triage Vital Signs: ED Triage Vitals  Encounter Vitals Group     BP 10/26/23 1023 132/87     Girls Systolic BP Percentile --      Girls Diastolic BP Percentile --      Boys Systolic BP Percentile --      Boys Diastolic BP Percentile --      Pulse Rate 10/26/23 1023 69     Resp 10/26/23 1023 17     Temp 10/26/23 1023 98.1 F (36.7 C)     Temp Source 10/26/23 1023 Oral     SpO2 10/26/23 1023 100 %     Weight 10/26/23 1024 235 lb (106.6 kg)     Height 10/26/23 1024 6' 1 (1.854 m)     Head Circumference --      Peak Flow --      Pain Score 10/26/23 1024 9     Pain Loc --      Pain Education --      Exclude from Growth Chart --     Most recent vital signs: Vitals:   10/26/23 1023  BP: 132/87  Pulse: 69  Resp: 17  Temp: 98.1 F (36.7 C)  SpO2: 100%    General Awake, no distress. NAD HEENT NCAT. PERRL. EOMI. No rhinorrhea. Mucous membranes are moist.  TMs intact bilaterally without evidence of edema or maceration to the canal.  TMs appear dull, and bulging, with seropurulent effusions noted. CV:  Good peripheral perfusion. RRR RESP:  Normal effort. CTA ABD:  No distention.    ED Results / Procedures / Treatments   Labs (all labs ordered are listed, but only abnormal results are displayed) Labs Reviewed  BASIC METABOLIC PANEL WITH GFR -  Abnormal; Notable for the following components:      Result Value   Glucose, Bld 105 (*)    All other components within normal limits  CBC  TROPONIN I (HIGH SENSITIVITY)  TROPONIN I (HIGH SENSITIVITY)     EKG  Ventricular rate 71 bpm PR interval 166 ms QRS duration 82 ms Normal axes No STEMI  RADIOLOGY  I personally viewed and evaluated these images as part of my medical decision making, as well as reviewing the written report by the radiologist.  ED Provider Interpretation: No acute findings  DG Chest 2 View Result Date: 10/26/2023 CLINICAL DATA:  Left-sided chest pain for 1 month. EXAM: CHEST - 2 VIEW COMPARISON:  10/07/2023 FINDINGS: The heart size and mediastinal contours are within normal limits. Both lungs are clear. The visualized skeletal structures are unremarkable. IMPRESSION: No active cardiopulmonary disease. Electronically Signed   By: Norleen DELENA Kil M.D.   On: 10/26/2023 11:06     PROCEDURES:  Critical Care performed: No  Procedures   MEDICATIONS ORDERED IN ED: Medications - No data to display   IMPRESSION / MDM /  ASSESSMENT AND PLAN / ED COURSE  I reviewed the triage vital signs and the nursing notes.                              Differential diagnosis includes, but is not limited to,  ACS, aortic dissection, pulmonary embolism, cardiac tamponade, pneumothorax, pneumonia, pericarditis, myocarditis, GI-related causes including esophagitis/gastritis, and musculoskeletal chest wall pain.    Patient's presentation is most consistent with acute complicated illness / injury requiring diagnostic workup.  Patient's diagnosis is consistent with atypical chest pain likely GI in nature, and bilateral AOM's.  Patient presents in no acute distress at this time, noting similar epigastric abdominal/chest pain as previous presentations.  Patient admits to being compliant with medication for his severe reflux symptoms.  His cardiac workup is normal and reassuring at this  time.  No acute lab diagnoses noted.  No EKG evidence of malignant arrhythmia.  Chest x-ray interpreted by me, reveals no acute cardiopulmonary process.  Low concern for ACS given the persistent nature of the patient's symptoms.  Patient is without respiratory distress or or abnormal vital signs to raise concern for PE or infection.  Patient will be discharged home with prescriptions for azithromycin  and instructions to take OTC allergy medicine. Patient is to follow up with his PCP and GI medicine as referred, as needed or otherwise directed. Patient is given ED precautions to return to the ED for any worsening or new symptoms.   FINAL CLINICAL IMPRESSION(S) / ED DIAGNOSES   Final diagnoses:  Atypical chest pain  Non-recurrent acute suppurative otitis media of both ears without spontaneous rupture of tympanic membranes     Rx / DC Orders   ED Discharge Orders     None        Note:  This document was prepared using Dragon voice recognition software and may include unintentional dictation errors.    Loyd Candida LULLA Aldona, PA-C 10/26/23 1247    Levander Slate, MD 10/26/23 [phone]

## 2023-11-28 ENCOUNTER — Emergency Department (HOSPITAL_BASED_OUTPATIENT_CLINIC_OR_DEPARTMENT_OTHER)
Admission: EM | Admit: 2023-11-28 | Discharge: 2023-11-28 | Disposition: A | Discharge status: Home / Self Care | Attending: Emergency Medicine | Admitting: Emergency Medicine

## 2023-11-28 ENCOUNTER — Other Ambulatory Visit: Payer: Self-pay

## 2023-11-28 ENCOUNTER — Encounter (HOSPITAL_BASED_OUTPATIENT_CLINIC_OR_DEPARTMENT_OTHER): Payer: Self-pay

## 2023-11-28 DIAGNOSIS — M79644 Pain in right finger(s): Secondary | ICD-10-CM | POA: Diagnosis present

## 2023-11-28 DIAGNOSIS — L03011 Cellulitis of right finger: Secondary | ICD-10-CM | POA: Diagnosis not present

## 2023-11-28 MED ORDER — SULFAMETHOXAZOLE-TRIMETHOPRIM 800-160 MG PO TABS
1.0000 | ORAL_TABLET | Freq: Once | ORAL | Status: AC
  Administered 2023-11-28: 1 via ORAL
  Filled 2023-11-28: qty 1

## 2023-11-28 MED ORDER — SULFAMETHOXAZOLE-TRIMETHOPRIM 800-160 MG PO TABS: 1.0000 | ORAL_TABLET | Freq: Two times a day (BID) | ORAL | 0 refills | Status: AC

## 2023-11-28 NOTE — Discharge Instructions (Addendum)
 You may continue taking Keflex.  I have also added in an antibiotic called Bactrim .  You may take this once in the morning once in the evening for the next 7 days.  Continue to soak your hand in salt water.  Symptoms should begin to improve over the next 24 to 48 hours.  Return to the emergency room if you develop worsening pain or swelling in your finger after 2 days.

## 2023-11-28 NOTE — ED Provider Notes (Signed)
 [place] EMERGENCY DEPARTMENT AT [facility] Provider Note   CSN: [id number] Arrival date & time: 11/28/23  8180     Patient presents with: Finger Injury   Robert Vazquez is a 33 y.o. male.   33 year old male here today with pain in his right second finger.  Went to urgent care 2 days ago, they prescribed him Keflex.  Feels as though it is getting worse.        Prior to Admission medications   Medication Sig Start Date End Date Taking? Authorizing Provider  azithromycin  (ZITHROMAX  Z-PAK) 250 MG tablet Take 2 tablets (500 mg) on  Day 1,  followed by 1 tablet (250 mg) once daily on Days 2 through 5. 10/26/23   Menshew, Candida LULLA Kings, PA-C  calcium  carbonate (TUMS) 500 MG chewable tablet Chew 2 tablets (400 mg of elemental calcium  total) by mouth 3 (three) times daily as needed for indigestion or heartburn. 10/07/23 10/06/24  Viviann Pastor, MD  famotidine  (PEPCID ) 20 MG tablet Take 1 tablet (20 mg total) by mouth 2 (two) times daily. 10/07/23   Viviann Pastor, MD  fluticasone  (FLONASE ) 50 MCG/ACT nasal spray Place 1 spray into both nostrils daily. 07/08/23   Janet Lonni BRAVO, MD  pantoprazole  (PROTONIX ) 20 MG tablet Take 1 tablet (20 mg total) by mouth daily. 09/06/23   Vincente Shivers, NP  sucralfate  (CARAFATE ) 1 g tablet Take 1 tablet (1 g total) by mouth 4 (four) times daily for 15 days. 09/14/23 09/29/23  Dorothyann Drivers, MD    Allergies: Patient has no known allergies.    Review of Systems  Updated Vital Signs BP 129/85 (BP Location: Right Arm)   Pulse 80   Temp 98.3 F (36.8 C)   Resp 17   SpO2 97%   Physical Exam Vitals and nursing note reviewed.  Cardiovascular:     Rate and Rhythm: Normal rate.  Abdominal:     General: Abdomen is flat.  Musculoskeletal:     Cervical back: Normal range of motion.     Comments: Paronychia present on the right second finger.  Some mild swelling of the pad of the second finger on the right hand.  Neurological:      Mental Status: He is alert.     (all labs ordered are listed, but only abnormal results are displayed) Labs Reviewed - No data to display  EKG: None  Radiology: No results found.   .Incision and Drainage  Date/Time: 11/28/2023 7:52 PM  Performed by: Mannie Fairy DASEN, DO Authorized by: Mannie Fairy DASEN, DO   Consent:    Consent obtained:  Verbal   Consent given by:  Patient   Risks discussed:  Bleeding, incomplete drainage and infection Universal protocol:    Patient identity confirmed:  Verbally with patient Location:    Type:  Abscess (Paronychia)   Size:  1.0 cm Pre-procedure details:    Skin preparation:  Chlorhexidine Sedation:    Sedation type:  None Anesthesia:    Anesthesia method:  Nerve block   Block location:  Digital block   Block needle gauge:  27 G   Block anesthetic:  Lidocaine  1% w/o epi Procedure type:    Complexity:  Simple Procedure details:    Drainage:  Purulent    Medications Ordered in the ED  sulfamethoxazole -trimethoprim  (BACTRIM  DS) 800-160 MG per tablet 1 tablet (has no administration in time range)  Medical Decision Making 33 year old male here today with pain in the right finger.  Differential diagnoses include paronychia, felon.  Plan-finger appears to be a felon.  Anesthetized, drained with a moderate amount of purulent fluid.  Discharged with Bactrim         Final diagnoses:  Paronychia of right index finger    ED Discharge Orders     None          Mannie Fairy DASEN, DO 11/28/23 1954

## 2023-11-28 NOTE — ED Triage Notes (Signed)
 Patient injured his index finger on his right hand. He was seen at urgent care and put on antibiotics. He states it has been two days and it has not gotten better. Reports that it is 10/10 pain and says it needs to be to be drained.

## 2024-02-10 ENCOUNTER — Ambulatory Visit (HOSPITAL_COMMUNITY): Admission: EM | Admit: 2024-02-10 | Discharge: 2024-02-10 | Disposition: A | Discharge status: Home / Self Care

## 2024-02-10 NOTE — ED Triage Notes (Signed)
 PT called from front lobby with no response.

## 2024-02-10 NOTE — ED Notes (Signed)
 No answer in waiting x2.

## 2024-02-11 ENCOUNTER — Ambulatory Visit (HOSPITAL_COMMUNITY)
Admission: EM | Admit: 2024-02-11 | Discharge: 2024-02-11 | Disposition: A | Discharge status: Home / Self Care | Attending: Physician Assistant | Admitting: Physician Assistant

## 2024-02-11 DIAGNOSIS — K0889 Other specified disorders of teeth and supporting structures: Secondary | ICD-10-CM

## 2024-02-11 MED ORDER — IBUPROFEN 800 MG PO TABS: 800.0000 mg | ORAL_TABLET | Freq: Three times a day (TID) | ORAL | 0 refills | Status: AC | PRN

## 2024-02-11 MED ORDER — AMOXICILLIN 500 MG PO CAPS: 500.0000 mg | ORAL_CAPSULE | Freq: Three times a day (TID) | ORAL | 0 refills | Status: AC

## 2024-02-11 NOTE — ED Triage Notes (Signed)
 Patient presents to the office for dental pain and headache x 2 days.

## 2024-02-11 NOTE — ED Provider Notes (Signed)
 [facility]    CSN: [id number] Arrival date & time: 02/11/24  0932      History   Chief Complaint Chief Complaint  Patient presents with   Dental Pain   Headache    HPI Robert Vazquez is a 33 y.o. male.   Patient complains of right sided facial pain.  Patient has pain to a tooth in the right side of his mouth.  Patient reports he has a headache on the right side.  Patient has pain radiating to his ear.  Patient does not have a current dentist.  The history is provided by the patient. No language interpreter was used.  Dental Pain Location:  Upper Upper teeth location:  2/RU 2nd molar Quality:  Aching Severity:  Moderate Onset quality:  Gradual Duration:  2 days Timing:  Constant Progression:  Worsening Chronicity:  New Previous work-up:  Dental exam Relieved by:  Nothing Worsened by:  Nothing Ineffective treatments:  None tried Associated symptoms: headaches   Headache   Past Medical History:  Diagnosis Date   GERD (gastroesophageal reflux disease) 2014   Hypertension     Patient Active Problem List   Diagnosis Date Noted   Current every day vaping 09/06/2023   Gastroesophageal reflux disease 07/30/2023   Hypertension 07/30/2023    No past surgical history on file.     Home Medications    Prior to Admission medications   Medication Sig Start Date End Date Taking? Authorizing Provider  amoxicillin (AMOXIL) 500 MG capsule Take 1 capsule (500 mg total) by mouth 3 (three) times daily. 02/11/24  Yes Amity Roes K, PA-C  ibuprofen  (ADVIL ) 800 MG tablet Take 1 tablet (800 mg total) by mouth every 8 (eight) hours as needed. 02/11/24  Yes Flint Raring K, PA-C  azithromycin  (ZITHROMAX  Z-PAK) 250 MG tablet Take 2 tablets (500 mg) on  Day 1,  followed by 1 tablet (250 mg) once daily on Days 2 through 5. 10/26/23   Menshew, Candida LULLA Kings, PA-C  calcium  carbonate (TUMS) 500 MG chewable tablet Chew 2 tablets (400 mg of elemental calcium  total) by mouth  3 (three) times daily as needed for indigestion or heartburn. 10/07/23 10/06/24  Viviann Pastor, MD  famotidine  (PEPCID ) 20 MG tablet Take 1 tablet (20 mg total) by mouth 2 (two) times daily. 10/07/23   Viviann Pastor, MD  fluticasone  (FLONASE ) 50 MCG/ACT nasal spray Place 1 spray into both nostrils daily. 07/08/23   Janet Lonni BRAVO, MD  pantoprazole  (PROTONIX ) 20 MG tablet Take 1 tablet (20 mg total) by mouth daily. 09/06/23   Vincente Shivers, NP  sucralfate  (CARAFATE ) 1 g tablet Take 1 tablet (1 g total) by mouth 4 (four) times daily for 15 days. 09/14/23 09/29/23  Dorothyann Drivers, MD    Family History Family History  Problem Relation Age of Onset   CAD Father    Heart disease Father     Social History Social History   Tobacco Use   Smoking status: Former    Types: Cigarettes   Smokeless tobacco: Never  Vaping Use   Vaping status: Every Day   Substances: Nicotine  Substance Use Topics   Alcohol use: Yes    Comment: socially   Drug use: No     Allergies   Patient has no known allergies.   Review of Systems Review of Systems  Neurological:  Positive for headaches.  All other systems reviewed and are negative.    Physical Exam Triage Vital Signs ED Triage Vitals  Encounter Vitals Group     BP 02/11/24 1049 128/73     Girls Systolic BP Percentile --      Girls Diastolic BP Percentile --      Boys Systolic BP Percentile --      Boys Diastolic BP Percentile --      Pulse Rate 02/11/24 1049 66     Resp 02/11/24 1052 18     Temp 02/11/24 1049 98.2 F (36.8 C)     Temp Source 02/11/24 1049 Oral     SpO2 02/11/24 1049 98 %     Weight --      Height --      Head Circumference --      Peak Flow --      Pain Score --      Pain Loc --      Pain Education --      Exclude from Growth Chart --    No data found.  Updated Vital Signs BP 128/73 (BP Location: Left Arm)   Pulse 66   Temp 98.2 F (36.8 C) (Oral)   Resp 18   SpO2 98%   Visual Acuity Right Eye  Distance:   Left Eye Distance:   Bilateral Distance:    Right Eye Near:   Left Eye Near:    Bilateral Near:     Physical Exam Vitals reviewed.  Constitutional:      Appearance: He is well-developed.  HENT:     Head: Normocephalic and atraumatic.     Mouth/Throat:     Comments: Swelling right upper gumline, tender to palpation,  swelling over tooth  Cardiovascular:     Rate and Rhythm: Normal rate.  Pulmonary:     Effort: Pulmonary effort is normal.  Musculoskeletal:        General: Normal range of motion.     Cervical back: Normal range of motion.  Skin:    General: Skin is warm.  Neurological:     Mental Status: He is alert.      UC Treatments / Results  Labs (all labs ordered are listed, but only abnormal results are displayed) Labs Reviewed - No data to display  EKG   Radiology No results found.  Procedures Procedures (including critical care time)  Medications Ordered in UC Medications - No data to display  Initial Impression / Assessment and Plan / UC Course  I have reviewed the triage vital signs and the nursing notes.  Pertinent labs & imaging results that were available during my care of the patient were reviewed by me and considered in my medical decision making (see chart for details).      Final Clinical Impressions(s) / UC Diagnoses   Final diagnoses:  Pain, dental     Discharge Instructions      Return if any problems.   ED Prescriptions     Medication Sig Dispense Auth. Provider   amoxicillin (AMOXIL) 500 MG capsule Take 1 capsule (500 mg total) by mouth 3 (three) times daily. 30 capsule Eivan Gallina K, PA-C   ibuprofen  (ADVIL ) 800 MG tablet Take 1 tablet (800 mg total) by mouth every 8 (eight) hours as needed. 30 tablet Warren Kugelman K, PA-C      PDMP not reviewed this encounter. An After Visit Summary was printed and given to the patient.       Flint Sonny POUR, PA-C 02/11/24 1127

## 2024-02-11 NOTE — Discharge Instructions (Addendum)
 Return if any problems.

## 2024-03-07 ENCOUNTER — Encounter: Admitting: General Practice

## 2024-03-07 DIAGNOSIS — Z0000 Encounter for general adult medical examination without abnormal findings: Secondary | ICD-10-CM

## 2024-05-25 ENCOUNTER — Encounter: Admitting: General Practice
